# Patient Record
Sex: Male | Born: 1980 | Race: White | Hispanic: No | Marital: Married | Smoking: Never smoker
Health system: Southern US, Community
[De-identification: ages and names within clinical notes are randomized; demographics above are authoritative.]

## PROBLEM LIST (undated history)

## (undated) DIAGNOSIS — M1990 Unspecified osteoarthritis, unspecified site: Secondary | ICD-10-CM

## (undated) DIAGNOSIS — J45909 Unspecified asthma, uncomplicated: Secondary | ICD-10-CM

## (undated) DIAGNOSIS — F419 Anxiety disorder, unspecified: Secondary | ICD-10-CM

## (undated) HISTORY — PX: OTHER SURGICAL HISTORY: SHX169

## (undated) HISTORY — DX: Unspecified asthma, uncomplicated: J45.909

---

## 2001-07-12 HISTORY — PX: APPENDECTOMY: SHX54

## 2011-09-23 ENCOUNTER — Other Ambulatory Visit: Payer: Self-pay | Admitting: Family Medicine

## 2011-09-23 MED ORDER — AMOXICILLIN 500 MG PO CAPS: 500.0000 mg | ORAL_CAPSULE | Freq: Two times a day (BID) | ORAL | Status: AC

## 2012-01-24 ENCOUNTER — Encounter: Payer: Self-pay | Admitting: *Deleted

## 2012-11-22 ENCOUNTER — Encounter: Payer: Self-pay | Admitting: Sports Medicine

## 2012-11-22 NOTE — Progress Notes (Signed)
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 Patient states he notices first while wearing some new custom orthotics as well as increasing some higher impact exercises. Patient does not remember any true injury to the area but started having a chronic dull aching sensation over the fourth and fifth metatarsals near the base that seem to be worse until it was severe pain. Patient did have a musculoskeletal ultrasound that did show cortical defect with hypoechoic changes at that time and was treated for stress fracture. Over the course of time patient has been doing the [employer] for 2 different durations of 3 weeks with some improvement. Patient has not started any true exercise but states having more pain now in the last week with even non weight bearing exercises such as biking. Patient is also tried ibuprofen with no help. Attempted sports insoles and metatarsal pads with minimal improvement.   Past surgical history is significant for 3 different surgeries of the ankles bilaterally when patient was a child. Patient is also had an appendectomy and 2003  No medications  Past medical history: Club feet, multiple orthopaedic injuries overtime but no significant surgery needed.   Social Hx:  No smoke, occ alcohol, married, from [place] originally.    Physical Exam Gen: NAD alert Right foot exam:  Cavus foot with severe supination.  mild swelling noted at distal 4/5th interspace.  TTP over base of fourth MTP and interspace between 3/4th and 4/5, NVI distally, able to ambulate without antalgic gait.    Assessment Right foot pain 6 months duration- ? Occult fracture, nueroma, vs ? AVN  Plan:  U/s inconclusive, xray unremarkable, need further imaging with MRI. Will order and discuss further.

## 2012-11-23 ENCOUNTER — Other Ambulatory Visit: Payer: Self-pay | Admitting: Sports Medicine

## 2012-11-23 ENCOUNTER — Encounter: Payer: Self-pay | Admitting: Sports Medicine

## 2012-11-23 MED ORDER — MELOXICAM 15 MG PO TABS: ORAL_TABLET | ORAL | Status: DC

## 2012-11-23 NOTE — Progress Notes (Signed)
On ultrasound Steve Dickerson has a third and fourth metatarsals stress injury as well as intermetatarsal bursitis between the fourth and fifth. Mobic.

## 2014-09-25 ENCOUNTER — Other Ambulatory Visit: Payer: Self-pay | Admitting: Internal Medicine

## 2014-09-25 MED ORDER — OSELTAMIVIR PHOSPHATE 75 MG PO CAPS: 75.0000 mg | ORAL_CAPSULE | Freq: Two times a day (BID) | ORAL | Status: DC

## 2014-09-25 NOTE — Telephone Encounter (Signed)
Dr Katrinka BlazingSmith + sceen for Influenza  Tx:  tamiflu 75 bid x 5 days

## 2015-06-02 ENCOUNTER — Telehealth: Payer: Self-pay | Admitting: Family Medicine

## 2015-06-02 MED ORDER — AMOXICILLIN-POT CLAVULANATE 875-125 MG PO TABS: 1.0000 | ORAL_TABLET | Freq: Two times a day (BID) | ORAL | Status: DC

## 2015-06-02 NOTE — Telephone Encounter (Signed)
Patient has sinus pain and pressure that is not improving with OTC tx. Plan for ABX.

## 2016-10-19 ENCOUNTER — Other Ambulatory Visit: Payer: Self-pay

## 2016-10-19 MED ORDER — CEPHALEXIN 500 MG PO CAPS: 500.0000 mg | ORAL_CAPSULE | Freq: Four times a day (QID) | ORAL | 0 refills | Status: DC

## 2017-06-10 DIAGNOSIS — H5213 Myopia, bilateral: Secondary | ICD-10-CM | POA: Diagnosis not present

## 2017-07-14 NOTE — Progress Notes (Signed)
Subjective:    Patient ID: Steve Dickerson, male    DOB: 05/02/1981, 37 y.o.   MRN: [id number]  HPI He is here to establish with a new pcp.   He is here for a physical exam.   He has no concerns.  He had blood work done recently for life insurance and it was normal.    Medications and allergies reviewed with patient and updated if appropriate.  There are no active problems to display for this patient.   No current outpatient medications on file prior to visit.   No current facility-administered medications on file prior to visit.     Past Medical History:  Diagnosis Date  . Asthma    childhood only    Past Surgical History:  Procedure Laterality Date  . 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 Marital status: Married    Spouse name: None  . Number of children: None  . Years of education: None  . Highest education level: None  Social Needs  . Financial resource strain: None  . Food insecurity - worry: None  . Food insecurity - inability: None  . Transportation needs - medical: None  . Transportation needs - non-medical: None  Occupational History  . None  Tobacco Use  . Smoking status: Never Smoker  . Smokeless tobacco: Never Used  Substance and Sexual Activity  . Alcohol use: Yes    Comment: social  . Drug use: No  . Sexual activity: None  Other Topics Concern  . None  Social History Narrative   Married, 2 kids   Exercises regularly    Family History  Problem Relation Age of Onset  . Hypertension Mother   . COPD Mother   . Asthma Mother   . Diabetes Father   . Hyperlipidemia Father   . Hypertension Father   . Alcohol abuse Father   . Polycystic ovary syndrome Sister   . ADD / ADHD Brother   . Mental illness Maternal Aunt   . Dementia Maternal Grandmother   . Lymphoma Maternal Grandmother   . Parkinson's disease Maternal Grandfather   . Dementia Paternal Grandmother   . Heart disease  Paternal Grandfather        died of MI at 3761    Review of Systems  Constitutional: Negative for chills and fever.  Eyes: Negative for visual disturbance.  Respiratory: Negative for cough, shortness of breath and wheezing.   Cardiovascular: Negative for chest pain, palpitations and leg swelling.  Gastrointestinal: Negative for abdominal pain, blood in stool, constipation, diarrhea and nausea.  Genitourinary: Negative for dysuria and hematuria.  Musculoskeletal: Positive for arthralgias (chronic, intermittent ankle pain).  Skin: Negative for color change.  Neurological: Negative for light-headedness and headaches.  Psychiatric/Behavioral: Negative for dysphoric mood. The patient is not nervous/anxious.        Objective:   Vitals:   07/15/17 1248  BP: 122/82  Pulse: (!) 58  Resp: 16  Temp: 98.1 F (36.7 C)  SpO2: 98%   Wt Readings from Last 3 Encounters:  07/15/17 169 lb (76.7 kg)   Body mass index is 23.57 kg/m.   Physical Exam    Constitutional: He appears well-developed and well-nourished. No distress.  HENT:  Head: Normocephalic and atraumatic.  Right Ear: External ear normal.  Left Ear: External ear normal.  Mouth/Throat: Oropharynx is clear and moist.  Normal ear canals and TM b/l  Eyes: Conjunctivae and EOM are normal.  Neck: Neck supple. No tracheal deviation present. No thyromegaly present.  No carotid bruit  Cardiovascular: Normal rate, regular rhythm, normal heart sounds and intact distal pulses.   No murmur heard. Pulmonary/Chest: Effort normal and breath sounds normal. No respiratory distress. He has no wheezes. He has no rales.  Abdominal: Soft. He exhibits no distension. There is no tenderness.  Genitourinary: deferred  Musculoskeletal: He exhibits no edema.  Lymphadenopathy:   He has no cervical adenopathy.  Skin: Skin is warm and dry. He is not diaphoretic.  Psychiatric: He has a normal mood and affect. His behavior is normal.        Assessment & Plan:   Physical exam: Screening blood work   Done for [employer] - will hold off on blood work for now, but may check at a later date Immunizations   Up to date  Eye exams -  Has contacts - exams regularly Exercise   Regular exercise Weight  Normal BMI Skin    No concerns Substance abuse  none  See Problem List for Assessment and Plan of chronic medical problems.    See Problem List for Assessment and Plan of chronic medical problems.

## 2017-07-15 ENCOUNTER — Encounter: Payer: Self-pay | Admitting: Internal Medicine

## 2017-07-15 ENCOUNTER — Ambulatory Visit (INDEPENDENT_AMBULATORY_CARE_PROVIDER_SITE_OTHER): Payer: 59 | Admitting: Internal Medicine

## 2017-07-15 VITALS — BP 122/82 | HR 58 | Temp 98.1°F | Resp 16 | Ht 71.0 in | Wt 169.0 lb

## 2017-07-15 DIAGNOSIS — Z0000 Encounter for general adult medical examination without abnormal findings: Secondary | ICD-10-CM

## 2017-07-15 NOTE — Patient Instructions (Addendum)
   All other Health Maintenance issues reviewed.   All recommended immunizations and age-appropriate screenings are up-to-date or discussed.  No immunizations administered today.   Medications reviewed and updated.  No changes recommended at this time.    

## 2017-09-06 ENCOUNTER — Other Ambulatory Visit: Payer: Self-pay | Admitting: Internal Medicine

## 2017-09-06 MED ORDER — OSELTAMIVIR PHOSPHATE 75 MG PO CAPS: 75.0000 mg | ORAL_CAPSULE | Freq: Every day | ORAL | 0 refills | Status: DC

## 2017-10-27 ENCOUNTER — Other Ambulatory Visit: Payer: Self-pay | Admitting: Occupational Medicine

## 2017-10-28 LAB — COMPLETE METABOLIC PANEL WITH GFR
AG Ratio: 2 (calc) (ref 1.0–2.5)
ALT: 18 U/L (ref 9–46)
AST: 22 U/L (ref 10–40)
Albumin: 4.5 g/dL (ref 3.6–5.1)
Alkaline phosphatase (APISO): 64 U/L (ref 40–115)
BUN: 24 mg/dL (ref 7–25)
CO2: 30 mmol/L (ref 20–32)
Calcium: 9.6 mg/dL (ref 8.6–10.3)
Chloride: 103 mmol/L (ref 98–110)
Creat: 1.1 mg/dL (ref 0.60–1.35)
GFR, Est African American: 100 mL/min/{1.73_m2} (ref >=60)
GFR, Est Non African American: 87 mL/min/{1.73_m2} (ref >=60)
Globulin: 2.2 g/dL (calc) (ref 1.9–3.7)
Glucose, Bld: 61 mg/dL — ABNORMAL LOW (ref 65–99)
Potassium: 4.6 mmol/L (ref 3.5–5.3)
Sodium: 139 mmol/L (ref 135–146)
Total Bilirubin: 0.6 mg/dL (ref 0.2–1.2)
Total Protein: 6.7 g/dL (ref 6.1–8.1)

## 2017-10-28 LAB — CBC WITH DIFFERENTIAL/PLATELET
Basophils Absolute: 30 cells/uL (ref 0–200)
Basophils Relative: 0.4 %
Eosinophils Absolute: 60 cells/uL (ref 15–500)
Eosinophils Relative: 0.8 %
HCT: 48.8 % (ref 38.5–50.0)
Hemoglobin: 16.2 g/dL (ref 13.2–17.1)
Lymphs Abs: 1538 cells/uL (ref 850–3900)
MCH: 29 pg (ref 27.0–33.0)
MCHC: 33.2 g/dL (ref 32.0–36.0)
MCV: 87.5 fL (ref 80.0–100.0)
MPV: 10.5 fL (ref 7.5–12.5)
Monocytes Relative: 9.8 %
Neutro Abs: 5138 cells/uL (ref 1500–7800)
Neutrophils Relative %: 68.5 %
Platelets: 353 10*3/uL (ref 140–400)
RBC: 5.58 10*6/uL (ref 4.20–5.80)
RDW: 13 % (ref 11.0–15.0)
Total Lymphocyte: 20.5 %
WBC mixed population: 735 cells/uL (ref 200–950)
WBC: 7.5 10*3/uL (ref 3.8–10.8)

## 2017-10-28 LAB — LIPID PANEL
Cholesterol: 177 mg/dL (ref <200)
HDL: 57 mg/dL (ref >40)
LDL Cholesterol (Calc): 103 mg/dL (calc) — ABNORMAL HIGH
Non-HDL Cholesterol (Calc): 120 mg/dL (calc) (ref <130)
Total CHOL/HDL Ratio: 3.1 (calc) (ref <5.0)
Triglycerides: 84 mg/dL (ref <150)

## 2017-10-28 LAB — PSA: PSA: 0.3 ng/mL (ref <=4.0)

## 2018-02-06 ENCOUNTER — Ambulatory Visit (INDEPENDENT_AMBULATORY_CARE_PROVIDER_SITE_OTHER)
Admission: RE | Admit: 2018-02-06 | Discharge: 2018-02-06 | Disposition: A | Discharge status: Home / Self Care | Payer: 59 | Source: Ambulatory Visit | Attending: Internal Medicine | Admitting: Internal Medicine

## 2018-02-06 ENCOUNTER — Other Ambulatory Visit: Payer: Self-pay | Admitting: Internal Medicine

## 2018-02-06 DIAGNOSIS — R102 Pelvic and perineal pain: Secondary | ICD-10-CM

## 2018-02-06 DIAGNOSIS — M545 Low back pain, unspecified: Secondary | ICD-10-CM

## 2018-02-08 ENCOUNTER — Other Ambulatory Visit (INDEPENDENT_AMBULATORY_CARE_PROVIDER_SITE_OTHER): Payer: 59

## 2018-02-08 ENCOUNTER — Encounter: Payer: Self-pay | Admitting: Internal Medicine

## 2018-02-08 ENCOUNTER — Ambulatory Visit (INDEPENDENT_AMBULATORY_CARE_PROVIDER_SITE_OTHER): Payer: 59 | Admitting: Internal Medicine

## 2018-02-08 VITALS — BP 138/96 | HR 55 | Ht 71.0 in | Wt 169.0 lb

## 2018-02-08 DIAGNOSIS — R1030 Lower abdominal pain, unspecified: Secondary | ICD-10-CM

## 2018-02-08 DIAGNOSIS — R590 Localized enlarged lymph nodes: Secondary | ICD-10-CM

## 2018-02-08 LAB — CBC WITH DIFFERENTIAL/PLATELET
Basophils Absolute: 0 10*3/uL (ref 0.0–0.1)
Basophils Relative: 0.7 % (ref 0.0–3.0)
Eosinophils Absolute: 0.1 10*3/uL (ref 0.0–0.7)
Eosinophils Relative: 0.9 % (ref 0.0–5.0)
HCT: 47.3 % (ref 39.0–52.0)
Hemoglobin: 16 g/dL (ref 13.0–17.0)
Lymphocytes Relative: 29.6 % (ref 12.0–46.0)
Lymphs Abs: 2.1 10*3/uL (ref 0.7–4.0)
MCHC: 33.9 g/dL (ref 30.0–36.0)
MCV: 89.6 fl (ref 78.0–100.0)
Monocytes Absolute: 0.7 10*3/uL (ref 0.1–1.0)
Monocytes Relative: 9.7 % (ref 3.0–12.0)
Neutro Abs: 4.2 10*3/uL (ref 1.4–7.7)
Neutrophils Relative %: 59.1 % (ref 43.0–77.0)
Platelets: 357 10*3/uL (ref 150.0–400.0)
RBC: 5.28 Mil/uL (ref 4.22–5.81)
RDW: 13.7 % (ref 11.5–15.5)
WBC: 7 10*3/uL (ref 4.0–10.5)

## 2018-02-08 LAB — COMPREHENSIVE METABOLIC PANEL
ALT: 23 U/L (ref 0–53)
AST: 20 U/L (ref 0–37)
Albumin: 4.6 g/dL (ref 3.5–5.2)
Alkaline Phosphatase: 66 U/L (ref 39–117)
BUN: 22 mg/dL (ref 6–23)
CO2: 30 mEq/L (ref 19–32)
Calcium: 9.6 mg/dL (ref 8.4–10.5)
Chloride: 101 mEq/L (ref 96–112)
Creatinine, Ser: 1.07 mg/dL (ref 0.40–1.50)
GFR: 83.12 mL/min (ref >60.00)
Glucose, Bld: 98 mg/dL (ref 70–99)
Potassium: 4.3 mEq/L (ref 3.5–5.1)
Sodium: 138 mEq/L (ref 135–145)
Total Bilirubin: 0.5 mg/dL (ref 0.2–1.2)
Total Protein: 7.3 g/dL (ref 6.0–8.3)

## 2018-02-08 LAB — URINALYSIS, ROUTINE W REFLEX MICROSCOPIC
Bilirubin Urine: NEGATIVE
Hgb urine dipstick: NEGATIVE
Ketones, ur: NEGATIVE
Leukocytes, UA: NEGATIVE
Nitrite: NEGATIVE
RBC / HPF: NONE SEEN (ref 0–?)
Specific Gravity, Urine: 1.01 (ref 1.000–1.030)
Total Protein, Urine: NEGATIVE
Urine Glucose: NEGATIVE
Urobilinogen, UA: 0.2 (ref 0.0–1.0)
pH: 6.5 (ref 5.0–8.0)

## 2018-02-08 LAB — C-REACTIVE PROTEIN: CRP: <0.1 mg/dL — ABNORMAL LOW (ref 0.5–20.0)

## 2018-02-08 LAB — SEDIMENTATION RATE: Sed Rate: 2 mm/hr (ref 0–15)

## 2018-02-08 NOTE — Assessment & Plan Note (Signed)
Check labs - cbc, cmp, crp, esr, hiv, rpr, ua, ucx Further evaluation depending on above results ? Infection, less likely cancer Consider [place] or Ct abd/pelvis

## 2018-02-08 NOTE — Progress Notes (Signed)
Subjective:    Patient ID: Steve Dickerson, male    DOB: 01/18/1981, 37 y.o.   MRN: [id number]  HPI The patient is here for an acute visit.  He started having left lower abdominal pain 3 weeks ago and thought he may have just strained himself lifting weights or doing something else.  The discomfort was intermittent.  Last weekend he thought he may have had a hernia - the pain was severe.  The next day the pain was a little better.  He had sharp pain with certain movements.  The pain then spread across his pubic symphysis toward the right side.  It also spreads back to the left SI joint pain.  He has had intermittent discomfort down his inner thigh b/l.     Today he noticed groin lymphadenitis.   He does not think other lymph nodes are enlarged.   He has traveled to [place] in the past month.   His wife and kids had a viral illness 2 weeks ago - possible cocksakie.        Medications and allergies reviewed with patient and updated if appropriate.  Patient Active Problem List   Diagnosis Date Noted  . Inguinal lymphadenopathy 02/08/2018    No current outpatient medications on file prior to visit.   No current facility-administered medications on file prior to visit.     Past Medical History:  Diagnosis Date  . Asthma    childhood only    Past Surgical History:  Procedure Laterality Date  . 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 Marital status: Married    Spouse name: Not on file  . Number of children: 2  . Years of education: Not on file  . Highest education level: Not on file  Occupational History  . Not on file  Social Needs  . Financial resource strain: Not on file  . Food insecurity:    Worry: Not on file    Inability: Not on file  . Transportation needs:    Medical: Not on file    Non-medical: Not on file  Tobacco Use  . Smoking status: Never Smoker  . Smokeless tobacco: Never Used  Substance and  Sexual Activity  . Alcohol use: Yes    Comment: social  . Drug use: No  . Sexual activity: Not on file  Lifestyle  . Physical activity:    Days per week: Not on file    Minutes per session: Not on file  . Stress: Not on file  Relationships  . Social connections:    Talks on phone: Not on file    Gets together: Not on file    Attends religious service: Not on file    Active member of club or organization: Not on file    Attends meetings of clubs or organizations: Not on file    Relationship status: Not on file  Other Topics Concern  . Not on file  Social History Narrative   Married, 2 kids   Exercises regularly    Family History  Problem Relation Age of Onset  . Hypertension Mother   . COPD Mother   . Asthma Mother   . Diabetes Father   . Hyperlipidemia Father   . Hypertension Father   . Alcohol abuse Father   . Polycystic ovary syndrome Sister   . ADD / ADHD Brother   . Mental illness Maternal Aunt   .  Dementia Maternal Grandmother   . Lymphoma Maternal Grandmother   . Parkinson's disease Maternal Grandfather   . Dementia Paternal Grandmother   . Heart disease Paternal Grandfather        died of MI at 66    Review of Systems  Constitutional: Negative for chills and fever.  Respiratory: Negative for cough, shortness of breath and wheezing.   Cardiovascular: Negative for chest pain, palpitations and leg swelling.  Gastrointestinal: Positive for abdominal pain (lower abdominal/pelvic). Negative for blood in stool, constipation and diarrhea.  Genitourinary: Negative for difficulty urinating, discharge, dysuria, frequency, hematuria and scrotal swelling.  Musculoskeletal: Positive for back pain.  Neurological: Positive for light-headedness (today). Negative for headaches.       Objective:   Vitals:   02/08/18 1234  BP: (!) 138/96  Pulse: (!) 55  SpO2: 96%   BP Readings from Last 3 Encounters:  02/08/18 (!) 138/96  07/15/17 122/82   Wt Readings from Last  3 Encounters:  02/08/18 169 lb (76.7 kg)  07/15/17 169 lb (76.7 kg)   Body mass index is 23.57 kg/m.   Physical Exam  Constitutional: He appears well-developed and well-nourished. No distress.  HENT:  Head: Normocephalic and atraumatic.  Neck: Neck supple. No tracheal deviation present. No thyromegaly present.  Abdominal: Soft. He exhibits no mass. There is tenderness (mild - across lower abdmen). There is no rebound and no guarding. No hernia.  Genitourinary:  Genitourinary Comments: Deferred - he denies lesions, swelling, tenderness  Musculoskeletal: He exhibits no edema.  Lymphadenopathy:       Head (right side): No preauricular, no posterior auricular and no occipital adenopathy present.       Head (left side): No preauricular, no posterior auricular and no occipital adenopathy present.       Right cervical: No superficial cervical and no posterior cervical adenopathy present.      Left cervical: No superficial cervical and no posterior cervical adenopathy present.    He has no axillary adenopathy.       Right: Inguinal adenopathy present. No supraclavicular adenopathy present.       Left: Inguinal adenopathy present. No supraclavicular adenopathy present.  Skin: Skin is warm and dry. He is not diaphoretic.         Assessment & Plan:    See Problem List for Assessment and Plan of chronic medical problems.

## 2018-02-08 NOTE — Assessment & Plan Note (Signed)
As above - no obvious hernia, no GI symptoms or GU symptoms Has some inguinal lymphadenopathy, lightheadedness today - not feeling well See results of above tests and will determine further w/u at that time

## 2018-02-10 LAB — URINE CULTURE
MICRO NUMBER:: 90905398
SPECIMEN QUALITY:: ADEQUATE

## 2018-02-10 LAB — HIV ANTIBODY (ROUTINE TESTING W REFLEX): HIV 1&2 Ab, 4th Generation: NONREACTIVE

## 2018-02-10 LAB — RPR: RPR Ser Ql: NONREACTIVE

## 2018-02-13 ENCOUNTER — Other Ambulatory Visit: Payer: Self-pay | Admitting: Internal Medicine

## 2018-02-13 DIAGNOSIS — R1032 Left lower quadrant pain: Secondary | ICD-10-CM

## 2018-02-13 DIAGNOSIS — R590 Localized enlarged lymph nodes: Secondary | ICD-10-CM

## 2018-02-13 DIAGNOSIS — R599 Enlarged lymph nodes, unspecified: Secondary | ICD-10-CM

## 2018-02-13 DIAGNOSIS — R1030 Lower abdominal pain, unspecified: Secondary | ICD-10-CM

## 2018-02-15 ENCOUNTER — Ambulatory Visit
Admission: RE | Admit: 2018-02-15 | Discharge: 2018-02-15 | Disposition: A | Discharge status: Home / Self Care | Payer: 59 | Source: Ambulatory Visit | Attending: Internal Medicine | Admitting: Internal Medicine

## 2018-02-15 DIAGNOSIS — K4090 Unilateral inguinal hernia, without obstruction or gangrene, not specified as recurrent: Secondary | ICD-10-CM | POA: Diagnosis not present

## 2018-02-15 DIAGNOSIS — R599 Enlarged lymph nodes, unspecified: Secondary | ICD-10-CM

## 2018-02-15 DIAGNOSIS — R1032 Left lower quadrant pain: Secondary | ICD-10-CM

## 2018-02-15 MED ORDER — GADOBENATE DIMEGLUMINE 529 MG/ML IV SOLN
15.0000 mL | Freq: Once | INTRAVENOUS | Status: AC | PRN
  Administered 2018-02-15: 15 mL via INTRAVENOUS

## 2018-04-03 ENCOUNTER — Other Ambulatory Visit: Payer: Self-pay | Admitting: Internal Medicine

## 2018-04-03 MED ORDER — VITAMIN D (ERGOCALCIFEROL) 1.25 MG (50000 UNIT) PO CAPS: 50000.0000 [IU] | ORAL_CAPSULE | ORAL | 1 refills | Status: DC

## 2018-04-10 ENCOUNTER — Other Ambulatory Visit: Payer: Self-pay

## 2018-04-10 ENCOUNTER — Ambulatory Visit (INDEPENDENT_AMBULATORY_CARE_PROVIDER_SITE_OTHER)
Admission: RE | Admit: 2018-04-10 | Discharge: 2018-04-10 | Disposition: A | Discharge status: Home / Self Care | Payer: 59 | Source: Ambulatory Visit | Attending: Family Medicine | Admitting: Family Medicine

## 2018-04-10 DIAGNOSIS — M25571 Pain in right ankle and joints of right foot: Secondary | ICD-10-CM

## 2018-04-10 DIAGNOSIS — S99911A Unspecified injury of right ankle, initial encounter: Secondary | ICD-10-CM | POA: Diagnosis not present

## 2018-04-10 DIAGNOSIS — M7989 Other specified soft tissue disorders: Secondary | ICD-10-CM | POA: Diagnosis not present

## 2018-04-21 DIAGNOSIS — H5213 Myopia, bilateral: Secondary | ICD-10-CM | POA: Diagnosis not present

## 2018-04-27 ENCOUNTER — Other Ambulatory Visit: Payer: Self-pay | Admitting: Internal Medicine

## 2018-04-27 DIAGNOSIS — M25571 Pain in right ankle and joints of right foot: Secondary | ICD-10-CM

## 2018-05-05 ENCOUNTER — Ambulatory Visit
Admission: RE | Admit: 2018-05-05 | Discharge: 2018-05-05 | Disposition: A | Discharge status: Home / Self Care | Payer: 59 | Source: Ambulatory Visit | Attending: Internal Medicine | Admitting: Internal Medicine

## 2018-05-05 DIAGNOSIS — S99911A Unspecified injury of right ankle, initial encounter: Secondary | ICD-10-CM | POA: Diagnosis not present

## 2018-05-05 DIAGNOSIS — M25571 Pain in right ankle and joints of right foot: Secondary | ICD-10-CM

## 2018-06-13 ENCOUNTER — Other Ambulatory Visit: Payer: Self-pay

## 2018-06-13 ENCOUNTER — Ambulatory Visit (INDEPENDENT_AMBULATORY_CARE_PROVIDER_SITE_OTHER)
Admission: RE | Admit: 2018-06-13 | Discharge: 2018-06-13 | Disposition: A | Discharge status: Home / Self Care | Payer: 59 | Source: Ambulatory Visit | Attending: Internal Medicine | Admitting: Internal Medicine

## 2018-06-13 ENCOUNTER — Other Ambulatory Visit (INDEPENDENT_AMBULATORY_CARE_PROVIDER_SITE_OTHER): Payer: 59

## 2018-06-13 DIAGNOSIS — M2550 Pain in unspecified joint: Secondary | ICD-10-CM

## 2018-06-13 DIAGNOSIS — G8929 Other chronic pain: Secondary | ICD-10-CM

## 2018-06-13 DIAGNOSIS — M25571 Pain in right ankle and joints of right foot: Secondary | ICD-10-CM

## 2018-06-13 DIAGNOSIS — M19071 Primary osteoarthritis, right ankle and foot: Secondary | ICD-10-CM | POA: Diagnosis not present

## 2018-06-13 LAB — FERRITIN: Ferritin: 47.3 ng/mL (ref 22.0–322.0)

## 2018-06-13 LAB — CBC WITH DIFFERENTIAL/PLATELET
Basophils Absolute: 0.1 10*3/uL (ref 0.0–0.1)
Basophils Relative: 0.7 % (ref 0.0–3.0)
Eosinophils Absolute: 0 10*3/uL (ref 0.0–0.7)
Eosinophils Relative: 0.2 % (ref 0.0–5.0)
HCT: 50.6 % (ref 39.0–52.0)
Hemoglobin: 16.8 g/dL (ref 13.0–17.0)
Lymphocytes Relative: 25.9 % (ref 12.0–46.0)
Lymphs Abs: 2.1 10*3/uL (ref 0.7–4.0)
MCHC: 33.2 g/dL (ref 30.0–36.0)
MCV: 89.4 fl (ref 78.0–100.0)
Monocytes Absolute: 0.7 10*3/uL (ref 0.1–1.0)
Monocytes Relative: 8.7 % (ref 3.0–12.0)
Neutro Abs: 5.2 10*3/uL (ref 1.4–7.7)
Neutrophils Relative %: 64.5 % (ref 43.0–77.0)
Platelets: 351 10*3/uL (ref 150.0–400.0)
RBC: 5.66 Mil/uL (ref 4.22–5.81)
RDW: 14.1 % (ref 11.5–15.5)
WBC: 8 10*3/uL (ref 4.0–10.5)

## 2018-06-13 LAB — SEDIMENTATION RATE: Sed Rate: 2 mm/hr (ref 0–15)

## 2018-06-13 LAB — D-DIMER, QUANTITATIVE: D-Dimer, Quant: <0.19 mcg/mL FEU (ref <0.50)

## 2018-06-13 LAB — URIC ACID: Uric Acid, Serum: 5.9 mg/dL (ref 4.0–7.8)

## 2018-06-13 LAB — VITAMIN D 25 HYDROXY (VIT D DEFICIENCY, FRACTURES): VITD: 39.98 ng/mL (ref 30.00–100.00)

## 2018-06-13 LAB — C-REACTIVE PROTEIN: CRP: <0.1 mg/dL — ABNORMAL LOW (ref 0.5–20.0)

## 2018-06-23 ENCOUNTER — Other Ambulatory Visit: Payer: Self-pay | Admitting: *Deleted

## 2018-06-23 DIAGNOSIS — R29898 Other symptoms and signs involving the musculoskeletal system: Secondary | ICD-10-CM

## 2018-06-23 NOTE — Progress Notes (Signed)
Bilateral Lower extremity weakness  I evaluated Steve Dickerson in a conference setting.  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  Now he is starting to have stress fractures that related to lack of muscular support for his activity level.  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  I tried him in a sample Toe-Off brace and he definitely improved his gait by using this.  I think he would benefit from Toe-off braces.  Ideally will use them bilaterally for most weight bearing activity.  Can start with weaker side should he choose and progress.  He may require some orthotic adjustment in his shoes to help better accomodate the braces.  Steve BigKB Fields, MD

## 2018-08-23 ENCOUNTER — Other Ambulatory Visit: Payer: Self-pay

## 2018-08-23 MED ORDER — GABAPENTIN 100 MG PO CAPS: ORAL_CAPSULE | ORAL | 1 refills | Status: DC

## 2018-08-23 MED ORDER — PREDNISONE 50 MG PO TABS: ORAL_TABLET | ORAL | 0 refills | Status: DC

## 2018-08-23 MED ORDER — GABAPENTIN 100 MG PO CAPS: 100.0000 mg | ORAL_CAPSULE | Freq: Every day | ORAL | 1 refills | Status: DC

## 2018-09-21 ENCOUNTER — Other Ambulatory Visit: Payer: Self-pay

## 2018-09-21 DIAGNOSIS — M25512 Pain in left shoulder: Secondary | ICD-10-CM

## 2018-09-25 ENCOUNTER — Other Ambulatory Visit: Payer: Self-pay

## 2018-09-25 ENCOUNTER — Ambulatory Visit (INDEPENDENT_AMBULATORY_CARE_PROVIDER_SITE_OTHER)
Admission: RE | Admit: 2018-09-25 | Discharge: 2018-09-25 | Disposition: A | Discharge status: Home / Self Care | Payer: 59 | Source: Ambulatory Visit | Attending: Internal Medicine | Admitting: Internal Medicine

## 2018-09-25 DIAGNOSIS — G8929 Other chronic pain: Secondary | ICD-10-CM

## 2018-09-25 DIAGNOSIS — M25519 Pain in unspecified shoulder: Secondary | ICD-10-CM | POA: Diagnosis not present

## 2018-09-25 DIAGNOSIS — M25512 Pain in left shoulder: Secondary | ICD-10-CM

## 2018-09-25 DIAGNOSIS — M25511 Pain in right shoulder: Secondary | ICD-10-CM

## 2019-04-12 ENCOUNTER — Other Ambulatory Visit: Payer: Self-pay | Admitting: Occupational Medicine

## 2019-04-12 LAB — CBC WITH DIFFERENTIAL/PLATELET
Absolute Monocytes: 706 cells/uL (ref 200–950)
Basophils Absolute: 58 cells/uL (ref 0–200)
Basophils Relative: 0.8 %
Eosinophils Absolute: 43 cells/uL (ref 15–500)
Eosinophils Relative: 0.6 %
HCT: 49.5 % (ref 38.5–50.0)
Hemoglobin: 16.4 g/dL (ref 13.2–17.1)
Lymphs Abs: 2614 cells/uL (ref 850–3900)
MCH: 29.3 pg (ref 27.0–33.0)
MCHC: 33.1 g/dL (ref 32.0–36.0)
MCV: 88.4 fL (ref 80.0–100.0)
MPV: 10.2 fL (ref 7.5–12.5)
Monocytes Relative: 9.8 %
Neutro Abs: 3780 cells/uL (ref 1500–7800)
Neutrophils Relative %: 52.5 %
Platelets: 374 10*3/uL (ref 140–400)
RBC: 5.6 10*6/uL (ref 4.20–5.80)
RDW: 12.8 % (ref 11.0–15.0)
Total Lymphocyte: 36.3 %
WBC: 7.2 10*3/uL (ref 3.8–10.8)

## 2019-04-12 LAB — COMPLETE METABOLIC PANEL WITH GFR
AG Ratio: 1.9 (calc) (ref 1.0–2.5)
ALT: 30 U/L (ref 9–46)
AST: 26 U/L (ref 10–40)
Albumin: 4.6 g/dL (ref 3.6–5.1)
Alkaline phosphatase (APISO): 74 U/L (ref 36–130)
BUN: 17 mg/dL (ref 7–25)
CO2: 27 mmol/L (ref 20–32)
Calcium: 9.7 mg/dL (ref 8.6–10.3)
Chloride: 100 mmol/L (ref 98–110)
Creat: 0.95 mg/dL (ref 0.60–1.35)
GFR, Est African American: 118 mL/min/{1.73_m2} (ref >=60)
GFR, Est Non African American: 102 mL/min/{1.73_m2} (ref >=60)
Globulin: 2.4 g/dL (calc) (ref 1.9–3.7)
Glucose, Bld: 61 mg/dL — ABNORMAL LOW (ref 65–99)
Potassium: 5.2 mmol/L (ref 3.5–5.3)
Sodium: 140 mmol/L (ref 135–146)
Total Bilirubin: 0.5 mg/dL (ref 0.2–1.2)
Total Protein: 7 g/dL (ref 6.1–8.1)

## 2019-04-12 LAB — LIPID PANEL
Cholesterol: 193 mg/dL (ref <200)
HDL: 59 mg/dL (ref >=40)
LDL Cholesterol (Calc): 112 mg/dL (calc) — ABNORMAL HIGH
Non-HDL Cholesterol (Calc): 134 mg/dL (calc) — ABNORMAL HIGH (ref <130)
Total CHOL/HDL Ratio: 3.3 (calc) (ref <5.0)
Triglycerides: 109 mg/dL (ref <150)

## 2019-04-12 LAB — SPECIMEN COMPROMISED

## 2019-04-12 LAB — PSA: PSA: 0.2 ng/mL (ref <=4.0)

## 2019-09-26 ENCOUNTER — Telehealth: Payer: Self-pay | Admitting: Family Medicine

## 2019-09-26 DIAGNOSIS — L03032 Cellulitis of left toe: Secondary | ICD-10-CM

## 2019-09-26 DIAGNOSIS — B351 Tinea unguium: Secondary | ICD-10-CM

## 2019-09-26 MED ORDER — DOXYCYCLINE HYCLATE 100 MG PO TABS: 100.0000 mg | ORAL_TABLET | Freq: Two times a day (BID) | ORAL | 0 refills | Status: AC

## 2019-09-26 NOTE — Telephone Encounter (Signed)
Left great toe paronychia.  Treatment with doxycycline for 7 to 10 days. Patient has onychomycosis which may be increasing his risk for paronychia or cellulitis.

## 2019-10-17 ENCOUNTER — Other Ambulatory Visit: Payer: Self-pay

## 2019-10-17 DIAGNOSIS — G5752 Tarsal tunnel syndrome, left lower limb: Secondary | ICD-10-CM

## 2019-10-18 ENCOUNTER — Ambulatory Visit (INDEPENDENT_AMBULATORY_CARE_PROVIDER_SITE_OTHER): Payer: 59

## 2019-10-18 ENCOUNTER — Other Ambulatory Visit: Payer: Self-pay

## 2019-10-18 DIAGNOSIS — G5752 Tarsal tunnel syndrome, left lower limb: Secondary | ICD-10-CM

## 2019-10-18 DIAGNOSIS — M19072 Primary osteoarthritis, left ankle and foot: Secondary | ICD-10-CM | POA: Diagnosis not present

## 2019-10-19 NOTE — Progress Notes (Signed)
X-ray foot shows effectively normal great toe.  Ankle and midfoot of course have chronic changes

## 2019-11-29 ENCOUNTER — Encounter: Payer: Self-pay | Admitting: Internal Medicine

## 2019-11-29 NOTE — Patient Instructions (Addendum)
All other Health Maintenance issues reviewed.   All recommended immunizations and age-appropriate screenings are up-to-date or discussed.  No immunization administered today.   Medications reviewed and updated.  Changes include :  none      Please followup in 1 year    Health Maintenance, Male Adopting a healthy lifestyle and getting preventive care are important in promoting health and wellness. Ask your health care provider about:  The right schedule for you to have regular tests and exams.  Things you can do on your own to prevent diseases and keep yourself healthy. What should I know about diet, weight, and exercise? Eat a healthy diet   Eat a diet that includes plenty of vegetables, fruits, low-fat dairy products, and lean protein.  Do not eat a lot of foods that are high in solid fats, added sugars, or sodium. Maintain a healthy weight Body mass index (BMI) is a measurement that can be used to identify possible weight problems. It estimates body fat based on height and weight. Your health care provider can help determine your BMI and help you achieve or maintain a healthy weight. Get regular exercise Get regular exercise. This is one of the most important things you can do for your health. Most adults should:  Exercise for at least 150 minutes each week. The exercise should increase your heart rate and make you sweat (moderate-intensity exercise).  Do strengthening exercises at least twice a week. This is in addition to the moderate-intensity exercise.  Spend less time sitting. Even light physical activity can be beneficial. Watch cholesterol and blood lipids Have your blood tested for lipids and cholesterol at 38 years of age, then have this test every 5 years. You may need to have your cholesterol levels checked more often if:  Your lipid or cholesterol levels are high.  You are older than 38 years of age.  You are at high risk for heart disease. What  should I know about cancer screening? Many types of cancers can be detected early and may often be prevented. Depending on your health history and family history, you may need to have cancer screening at various ages. This may include screening for:  Colorectal cancer.  Prostate cancer.  Skin cancer.  Lung cancer. What should I know about heart disease, diabetes, and high blood pressure? Blood pressure and heart disease  High blood pressure causes heart disease and increases the risk of stroke. This is more likely to develop in people who have high blood pressure readings, are of African descent, or are overweight.  Talk with your health care provider about your target blood pressure readings.  Have your blood pressure checked: ? Every 3-5 years if you are 61-47 years of age. ? Every year if you are 3 years old or older.  If you are between the ages of 71 and 71 and are a current or former smoker, ask your health care provider if you should have a one-time screening for abdominal aortic aneurysm (AAA). Diabetes Have regular diabetes screenings. This checks your fasting blood sugar level. Have the screening done:  Once every three years after age 61 if you are at a normal weight and have a low risk for diabetes.  More often and at a younger age if you are overweight or have a high risk for diabetes. What should I know about preventing infection? Hepatitis B If you have a higher risk for hepatitis B, you should be screened for this virus. Talk  with your health care provider to find out if you are at risk for hepatitis B infection. Hepatitis C Blood testing is recommended for:  Everyone born from [birth year] through 1964.  Anyone with known risk factors for hepatitis C. Sexually transmitted infections (STIs)  You should be screened each year for STIs, including gonorrhea and chlamydia, if: ? You are sexually active and are younger than 38 years of age. ? You are older than 38 years of  age and your health care provider tells you that you are at risk for this type of infection. ? Your sexual activity has changed since you were last screened, and you are at increased risk for chlamydia or gonorrhea. Ask your health care provider if you are at risk.  Ask your health care provider about whether you are at high risk for HIV. Your health care provider may recommend a prescription medicine to help prevent HIV infection. If you choose to take medicine to prevent HIV, you should first get tested for HIV. You should then be tested every 3 months for as long as you are taking the medicine. Follow these instructions at home: Lifestyle  Do not use any products that contain nicotine or tobacco, such as cigarettes, e-cigarettes, and chewing tobacco. If you need help quitting, ask your health care provider.  Do not use street drugs.  Do not share needles.  Ask your health care provider for help if you need support or information about quitting drugs. Alcohol use  Do not drink alcohol if your health care provider tells you not to drink.  If you drink alcohol: ? Limit how much you have to 0-2 drinks a day. ? Be aware of how much alcohol is in your drink. In the [place] one drink equals one 12 oz bottle of beer (355 mL), one 5 oz glass of wine (148 mL), or one 1 oz glass of hard liquor (44 mL). General instructions  Schedule regular health, dental, and eye exams.  Stay current with your vaccines.  Tell your health care provider if: ? You often feel depressed. ? You have ever been abused or do not feel safe at home. Summary  Adopting a healthy lifestyle and getting preventive care are important in promoting health and wellness.  Follow your health care provider's instructions about healthy diet, exercising, and getting tested or screened for diseases.  Follow your health care provider's instructions on monitoring your cholesterol and blood pressure. This information is not intended  to replace advice given to you by your health care provider. Make sure you discuss any questions you have with your health care provider. Document Revised: 06/21/2018 Document Reviewed: 06/21/2018 Elsevier Patient Education  2020 [employer].

## 2019-11-29 NOTE — Progress Notes (Signed)
Subjective:    Patient ID: Steve Dickerson, male    DOB: [birth year], 39 y.o.   MRN: [id number]  HPI He is here for a physical exam.   The past 5 days he has been experiencing bilateral thumb and big toe pain.  He had a little bit of inflammation in the right first MCP joint on ultrasound.  He denies any injury or new activity that could have caused it.  He wondered about something viral nature.  He denies any increased neck pain.  Energy level low to begin with-likely related to decreased sleep.  Medications and allergies reviewed with patient and updated if appropriate.  Patient Active Problem List   Diagnosis Date Noted  . Onychomycosis 09/26/2019    No current outpatient medications on file prior to visit.   No current facility-administered medications on file prior to visit.    Past Medical History:  Diagnosis Date  . Asthma    childhood only    Past Surgical History:  Procedure Laterality Date  . 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 Marital status: Married    Spouse name: Not on file  . Number of children: 2  . Years of education: Not on file  . Highest education level: Not on file  Occupational History  . Not on file  Tobacco Use  . Smoking status: Never Smoker  . Smokeless tobacco: Never Used  Substance and Sexual Activity  . Alcohol use: Yes    Comment: social  . Drug use: No  . Sexual activity: Not on file  Other Topics Concern  . Not on file  Social History Narrative   Married, 2 kids   Exercises regularly   Social Determinants of Health   Financial Resource Strain:   . Difficulty of Paying Living Expenses:   Food Insecurity:   . Worried About Programme researcher, broadcasting/film/video in the Last Year:   . Barista in the Last Year:   Transportation Needs:   . Freight forwarder (Medical):   Marland Kitchen Lack of Transportation (Non-Medical):   Physical Activity:   . Days of Exercise per Week:   .  Minutes of Exercise per Session:   Stress:   . Feeling of Stress :   Social Connections:   . Frequency of Communication with Friends and Family:   . Frequency of Social Gatherings with Friends and Family:   . Attends Religious Services:   . Active Member of Clubs or Organizations:   . Attends Banker Meetings:   Marland Kitchen Marital Status:     Family History  Problem Relation Age of Onset  . Hypertension Mother   . COPD Mother   . Asthma Mother   . Diabetes Father   . Hyperlipidemia Father   . Hypertension Father   . Alcohol abuse Father   . Polycystic ovary syndrome Sister   . ADD / ADHD Brother   . Mental illness Maternal Aunt   . Dementia Maternal Grandmother   . Lymphoma Maternal Grandmother   . Parkinson's disease Maternal Grandfather   . Dementia Paternal Grandmother   . Heart disease Paternal Grandfather        died of MI at 49    Review of Systems  Constitutional: Negative for chills and fever.  Eyes: Negative for visual disturbance.  Respiratory: Negative for cough, shortness of breath and wheezing.   Cardiovascular: Negative  for chest pain, palpitations and leg swelling.  Gastrointestinal: Negative for abdominal pain, blood in stool, constipation, diarrhea and nausea.  Genitourinary: Negative for difficulty urinating, dysuria and hematuria.  Musculoskeletal: Positive for arthralgias.  Skin: Negative for color change and rash.  Neurological: Negative for dizziness, light-headedness and headaches.  Psychiatric/Behavioral: Negative for dysphoric mood. The patient is not nervous/anxious.        Objective:   Vitals:   11/30/19 1532  BP: 138/84  Pulse: 65  Resp: 16  Temp: 98.3 F (36.8 C)  SpO2: 98%   Filed Weights   11/30/19 1532  Weight: 170 lb (77.1 kg)   Body mass index is 23.71 kg/m.  BP Readings from Last 3 Encounters:  11/30/19 138/84  02/08/18 (!) 138/96  07/15/17 122/82    Wt Readings from Last 3 Encounters:  11/30/19 170 lb (77.1  kg)  02/08/18 169 lb (76.7 kg)  07/15/17 169 lb (76.7 kg)     Physical Exam Constitutional: He appears well-developed and well-nourished. No distress.  HENT:  Head: Normocephalic and atraumatic.  Right Ear: External ear normal.  Left Ear: External ear normal.  Mouth/Throat: Oropharynx is clear and moist.  Normal ear canals and TM b/l  Eyes: Conjunctivae and EOM are normal.  Neck: Neck supple. No tracheal deviation present. No thyromegaly present. No carotid bruit  Cardiovascular: Normal rate, regular rhythm, normal heart sounds and intact distal pulses.   No murmur heard. No edema Pulmonary/Chest: Effort normal and breath sounds normal. No respiratory distress. He has no wheezes. He has no rales.  Abdominal: Soft. He exhibits no distension. There is no tenderness.  Genitourinary: deferred  Musculoskeletal: no joint deformity of gross swelling Lymphadenopathy:   He has no cervical adenopathy.  Skin: Skin is warm and dry. He is not diaphoretic.  Psychiatric: He has a normal mood and affect. His behavior is normal.         Assessment & Plan:   Physical exam: Screening blood work  Deferred - had blood work done last fall Immunizations    Up to date  Exercise   regular Weight  Normal BMI Substance abuse    none  See Problem List for Assessment and Plan of chronic medical problems.    This visit occurred during the SARS-CoV-2 public health emergency.  Safety protocols were in place, including screening questions prior to the visit, additional usage of staff PPE, and extensive cleaning of exam room while observing appropriate contact time as indicated for disinfecting solutions.

## 2019-11-30 ENCOUNTER — Encounter: Payer: Self-pay | Admitting: Internal Medicine

## 2019-11-30 ENCOUNTER — Other Ambulatory Visit: Payer: Self-pay

## 2019-11-30 ENCOUNTER — Ambulatory Visit (INDEPENDENT_AMBULATORY_CARE_PROVIDER_SITE_OTHER): Payer: 59 | Admitting: Internal Medicine

## 2019-11-30 VITALS — BP 138/84 | HR 65 | Temp 98.3°F | Resp 16 | Ht 71.0 in | Wt 170.0 lb

## 2019-11-30 DIAGNOSIS — Z0000 Encounter for general adult medical examination without abnormal findings: Secondary | ICD-10-CM

## 2020-04-03 ENCOUNTER — Encounter: Payer: Self-pay | Admitting: Internal Medicine

## 2020-04-03 ENCOUNTER — Ambulatory Visit (INDEPENDENT_AMBULATORY_CARE_PROVIDER_SITE_OTHER): Payer: 59

## 2020-04-03 ENCOUNTER — Other Ambulatory Visit: Payer: Self-pay | Admitting: Internal Medicine

## 2020-04-03 ENCOUNTER — Other Ambulatory Visit: Payer: Self-pay

## 2020-04-03 ENCOUNTER — Ambulatory Visit (INDEPENDENT_AMBULATORY_CARE_PROVIDER_SITE_OTHER): Payer: 59 | Admitting: Internal Medicine

## 2020-04-03 VITALS — BP 128/82 | HR 58

## 2020-04-03 DIAGNOSIS — R0789 Other chest pain: Secondary | ICD-10-CM

## 2020-04-03 DIAGNOSIS — R002 Palpitations: Secondary | ICD-10-CM

## 2020-04-03 DIAGNOSIS — J45909 Unspecified asthma, uncomplicated: Secondary | ICD-10-CM | POA: Insufficient documentation

## 2020-04-03 DIAGNOSIS — Z0184 Encounter for antibody response examination: Secondary | ICD-10-CM

## 2020-04-03 NOTE — Addendum Note (Signed)
Addended by: Pincus Sanes on: 04/03/2020 07:51 AM   Modules accepted: Orders

## 2020-04-03 NOTE — Assessment & Plan Note (Addendum)
Acute A few weeks of elevated HR, w/o true irregularity.  Baseline HR much higher than typical Has some chest pressure, ?pleuritic in nature Exam wnl now Will get labs - cbc, cmp, esr, d-dimer, tfts EKG today -  Sinus brady 58 bpm, non-specific t wave abnormality, no acute changes, no prior EKG for comparison CXR Consider Echo and possible holter/cardio eval

## 2020-04-03 NOTE — Assessment & Plan Note (Addendum)
Acute Substernal chest pressure - intermittent, ? Pleuritic in nature, ? positional No GERD, cold symptoms A few weeks of elevated HR, w/o true irregularity.  Baseline HR much higher than typical Exam wnl now -no obvious rub or murmur, HR RRR Will get labs - cbc, cmp, esr, d-dimer, tfts EKG, CXR today Consider Echo and possible holter/cardio eval

## 2020-04-03 NOTE — Progress Notes (Signed)
Subjective:    Patient ID: Steve Dickerson, male    DOB: 29-May-1981, 39 y.o.   MRN: [id number]  HPI The patient is here for an acute visit.  For a few weeks he has noticed his HR at baseline has been higher than usual - about 40-50 bmp higher.   He has had some mild palpitations and sensation of chest pressure at times.  The chest pressure is substernal and has a pleuritic feel to it at times.   He denies chest pain, lightheaded/dizziness.  He denies obvious irregularity to his heart beat.    No cold symptoms, fever.  Energy level is normal. No GI symptoms.  No gerd. No leg swelling.   A few weeks ago he did have increased stress, which has greatly improved.   He stopped caffeine and supplements ( vitamin d, mvi)  w/o change in his symptoms.     Medications and allergies reviewed with patient and updated if appropriate.  Patient Active Problem List   Diagnosis Date Noted  . Childhood asthma 04/03/2020  . Palpitations 04/03/2020  . Sensation of chest pressure 04/03/2020  . Onychomycosis 09/26/2019    No current outpatient medications on file prior to visit.   No current facility-administered medications on file prior to visit.    Past Medical History:  Diagnosis Date  . Asthma    childhood only    Past Surgical History:  Procedure Laterality Date  . 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 Marital status: Married    Spouse name: Not on file  . Number of children: 2  . Years of education: Not on file  . Highest education level: Not on file  Occupational History  . Not on file  Tobacco Use  . Smoking status: Never Smoker  . Smokeless tobacco: Never Used  Substance and Sexual Activity  . Alcohol use: Yes    Comment: social  . Drug use: No  . Sexual activity: Not on file  Other Topics Concern  . Not on file  Social History Narrative   Married, 2 kids   Exercises regularly   Social Determinants of  Health   Financial Resource Strain:   . Difficulty of Paying Living Expenses: Not on file  Food Insecurity:   . Worried About Programme researcher, broadcasting/film/video in the Last Year: Not on file  . Ran Out of Food in the Last Year: Not on file  Transportation Needs:   . Lack of Transportation (Medical): Not on file  . Lack of Transportation (Non-Medical): Not on file  Physical Activity:   . Days of Exercise per Week: Not on file  . Minutes of Exercise per Session: Not on file  Stress:   . Feeling of Stress : Not on file  Social Connections:   . Frequency of Communication with Friends and Family: Not on file  . Frequency of Social Gatherings with Friends and Family: Not on file  . Attends Religious Services: Not on file  . Active Member of Clubs or Organizations: Not on file  . Attends Banker Meetings: Not on file  . Marital Status: Not on file    Family History  Problem Relation Age of Onset  . Hypertension Mother   . COPD Mother   . Asthma Mother   . Diabetes Father   . Hyperlipidemia Father   . Hypertension Father   . Alcohol abuse  Father   . Polycystic ovary syndrome Sister   . ADD / ADHD Brother   . Mental illness Maternal Aunt   . Dementia Maternal Grandmother   . Lymphoma Maternal Grandmother   . Parkinson's disease Maternal Grandfather   . Dementia Paternal Grandmother   . Heart disease Paternal Grandfather        died of MI at 35    Review of Systems  Constitutional: Negative for fever.  Eyes: Negative for visual disturbance.  Respiratory: Negative for cough, shortness of breath and wheezing.   Cardiovascular: Positive for chest pain (pressure sensation) and palpitations. Negative for leg swelling.  Gastrointestinal: Negative for abdominal pain, constipation, diarrhea and nausea.  Neurological: Negative for dizziness, light-headedness and headaches.       Objective:   Vitals:   04/03/20 1224  BP: 128/82  Pulse: (!) 58  SpO2: 98%   BP Readings from Last  3 Encounters:  04/03/20 128/82  11/30/19 138/84  02/08/18 (!) 138/96   Wt Readings from Last 3 Encounters:  11/30/19 170 lb (77.1 kg)  02/08/18 169 lb (76.7 kg)  07/15/17 169 lb (76.7 kg)   There is no height or weight on file to calculate BMI.   Physical Exam    Constitutional: Appears well-developed and well-nourished. No distress.  Head: Normocephalic and atraumatic.  Neck: Neck supple. No tracheal deviation present. No thyromegaly present.  No cervical lymphadenopathy Cardiovascular: Normal rate, regular rhythm and normal heart sounds.  No murmur heard. No carotid bruit .  No edema Pulmonary/Chest: no chest wall tenderness. Effort normal and breath sounds normal. No respiratory distress. No has no wheezes. No rales.  Skin: Skin is warm and dry. Not diaphoretic.  Psychiatric: Normal mood and affect. Behavior is normal.         Assessment & Plan:    See Problem List for Assessment and Plan of chronic medical problems.    This visit occurred during the SARS-CoV-2 public health emergency.  Safety protocols were in place, including screening questions prior to the visit, additional usage of staff PPE, and extensive cleaning of exam room while observing appropriate contact time as indicated for disinfecting solutions.

## 2020-04-04 LAB — COMPLETE METABOLIC PANEL WITH GFR
AG Ratio: 2.1 (calc) (ref 1.0–2.5)
ALT: 19 U/L (ref 9–46)
AST: 22 U/L (ref 10–40)
Albumin: 4.8 g/dL (ref 3.6–5.1)
Alkaline phosphatase (APISO): 72 U/L (ref 36–130)
BUN: 16 mg/dL (ref 7–25)
CO2: 29 mmol/L (ref 20–32)
Calcium: 9.8 mg/dL (ref 8.6–10.3)
Chloride: 100 mmol/L (ref 98–110)
Creat: 0.93 mg/dL (ref 0.60–1.35)
GFR, Est African American: 120 mL/min/{1.73_m2} (ref >=60)
GFR, Est Non African American: 104 mL/min/{1.73_m2} (ref >=60)
Globulin: 2.3 g/dL (calc) (ref 1.9–3.7)
Glucose, Bld: 83 mg/dL (ref 65–99)
Potassium: 4.7 mmol/L (ref 3.5–5.3)
Sodium: 138 mmol/L (ref 135–146)
Total Bilirubin: 0.6 mg/dL (ref 0.2–1.2)
Total Protein: 7.1 g/dL (ref 6.1–8.1)

## 2020-04-04 LAB — CBC WITH DIFFERENTIAL/PLATELET
Absolute Monocytes: 772 cells/uL (ref 200–950)
Basophils Absolute: 62 cells/uL (ref 0–200)
Basophils Relative: 0.8 %
Eosinophils Absolute: 47 cells/uL (ref 15–500)
Eosinophils Relative: 0.6 %
HCT: 49.7 % (ref 38.5–50.0)
Hemoglobin: 16.5 g/dL (ref 13.2–17.1)
Lymphs Abs: 2153 cells/uL (ref 850–3900)
MCH: 30.1 pg (ref 27.0–33.0)
MCHC: 33.2 g/dL (ref 32.0–36.0)
MCV: 90.7 fL (ref 80.0–100.0)
MPV: 10.5 fL (ref 7.5–12.5)
Monocytes Relative: 9.9 %
Neutro Abs: 4766 cells/uL (ref 1500–7800)
Neutrophils Relative %: 61.1 %
Platelets: 357 10*3/uL (ref 140–400)
RBC: 5.48 10*6/uL (ref 4.20–5.80)
RDW: 12.5 % (ref 11.0–15.0)
Total Lymphocyte: 27.6 %
WBC: 7.8 10*3/uL (ref 3.8–10.8)

## 2020-04-04 LAB — SEDIMENTATION RATE: Sed Rate: 2 mm/h (ref 0–15)

## 2020-04-04 LAB — D-DIMER, QUANTITATIVE: D-Dimer, Quant: 0.19 mcg/mL FEU (ref <0.50)

## 2020-04-04 LAB — TSH: TSH: 2.03 mIU/L (ref 0.40–4.50)

## 2020-04-04 LAB — T3, FREE: T3, Free: 3.2 pg/mL (ref 2.3–4.2)

## 2020-04-04 LAB — SARS-COV-2 ANTIBODIES: SARS-CoV-2 Antibodies: NEGATIVE

## 2020-04-04 LAB — T4, FREE: Free T4: 1.4 ng/dL (ref 0.8–1.8)

## 2020-04-04 LAB — SPECIMEN STATUS REPORT

## 2020-04-08 ENCOUNTER — Other Ambulatory Visit: Payer: Self-pay | Admitting: Internal Medicine

## 2020-04-08 DIAGNOSIS — R002 Palpitations: Secondary | ICD-10-CM

## 2020-04-25 ENCOUNTER — Other Ambulatory Visit: Payer: Self-pay

## 2020-04-25 ENCOUNTER — Ambulatory Visit (HOSPITAL_COMMUNITY): Payer: 59 | Attending: Cardiology

## 2020-04-25 DIAGNOSIS — R002 Palpitations: Secondary | ICD-10-CM | POA: Diagnosis not present

## 2020-04-25 LAB — ECHOCARDIOGRAM COMPLETE
Area-P 1/2: 3.76 cm2
S' Lateral: 3.3 cm

## 2020-04-29 ENCOUNTER — Other Ambulatory Visit: Payer: Self-pay | Admitting: Internal Medicine

## 2020-04-29 ENCOUNTER — Other Ambulatory Visit (HOSPITAL_COMMUNITY): Payer: Self-pay | Admitting: Internal Medicine

## 2020-04-29 MED ORDER — BUSPIRONE HCL 5 MG PO TABS: 5.0000 mg | ORAL_TABLET | Freq: Three times a day (TID) | ORAL | 5 refills | Status: DC

## 2020-05-06 IMAGING — DX DG ANKLE COMPLETE 3+V*R*
3 series · 4 of 4 positions shown · non-contrast
Comparison: None.

CLINICAL DATA: Trauma 7 days ago with persistent pain and swelling,
initial encounter

EXAM:
RIGHT ANKLE - COMPLETE 3+ VIEW

[Series 1: ankle ap · 0.14mm/px · 2 of 2 slices shown]
[im 1/2]
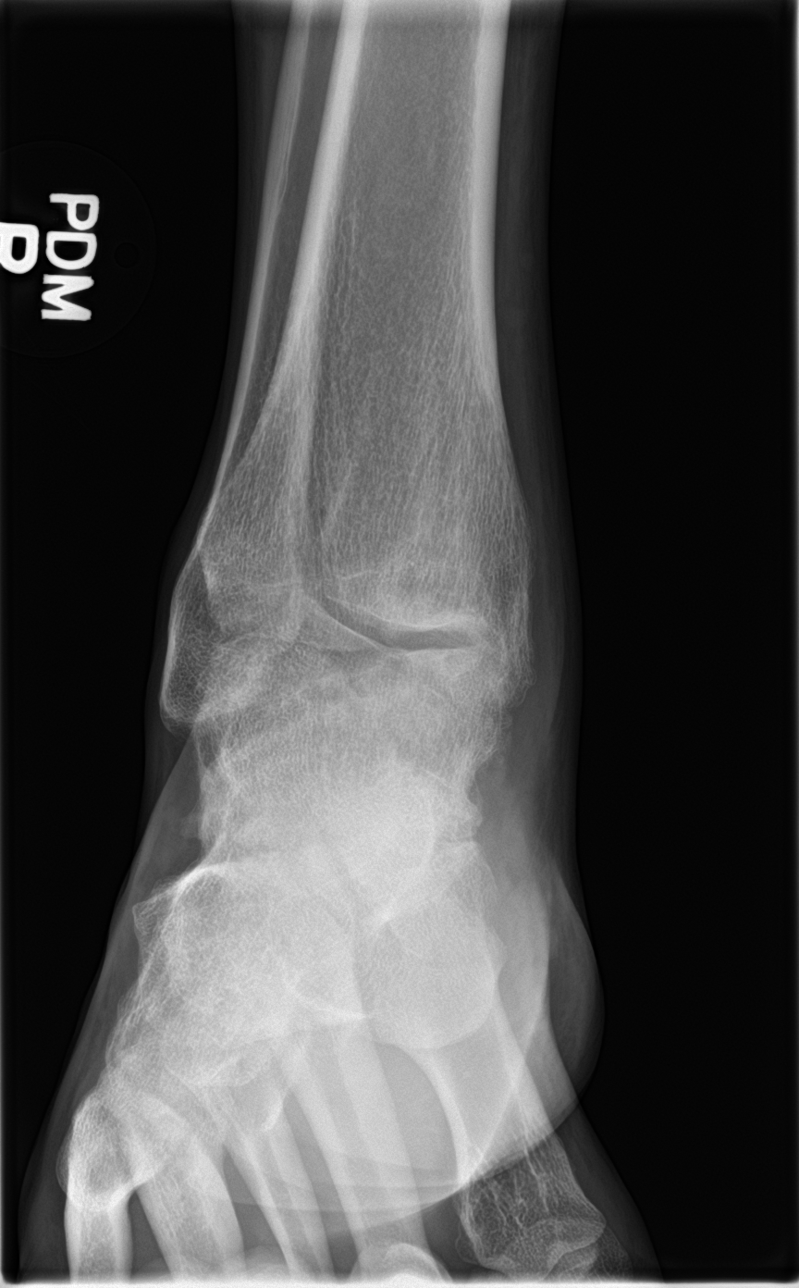
[im 2/2]
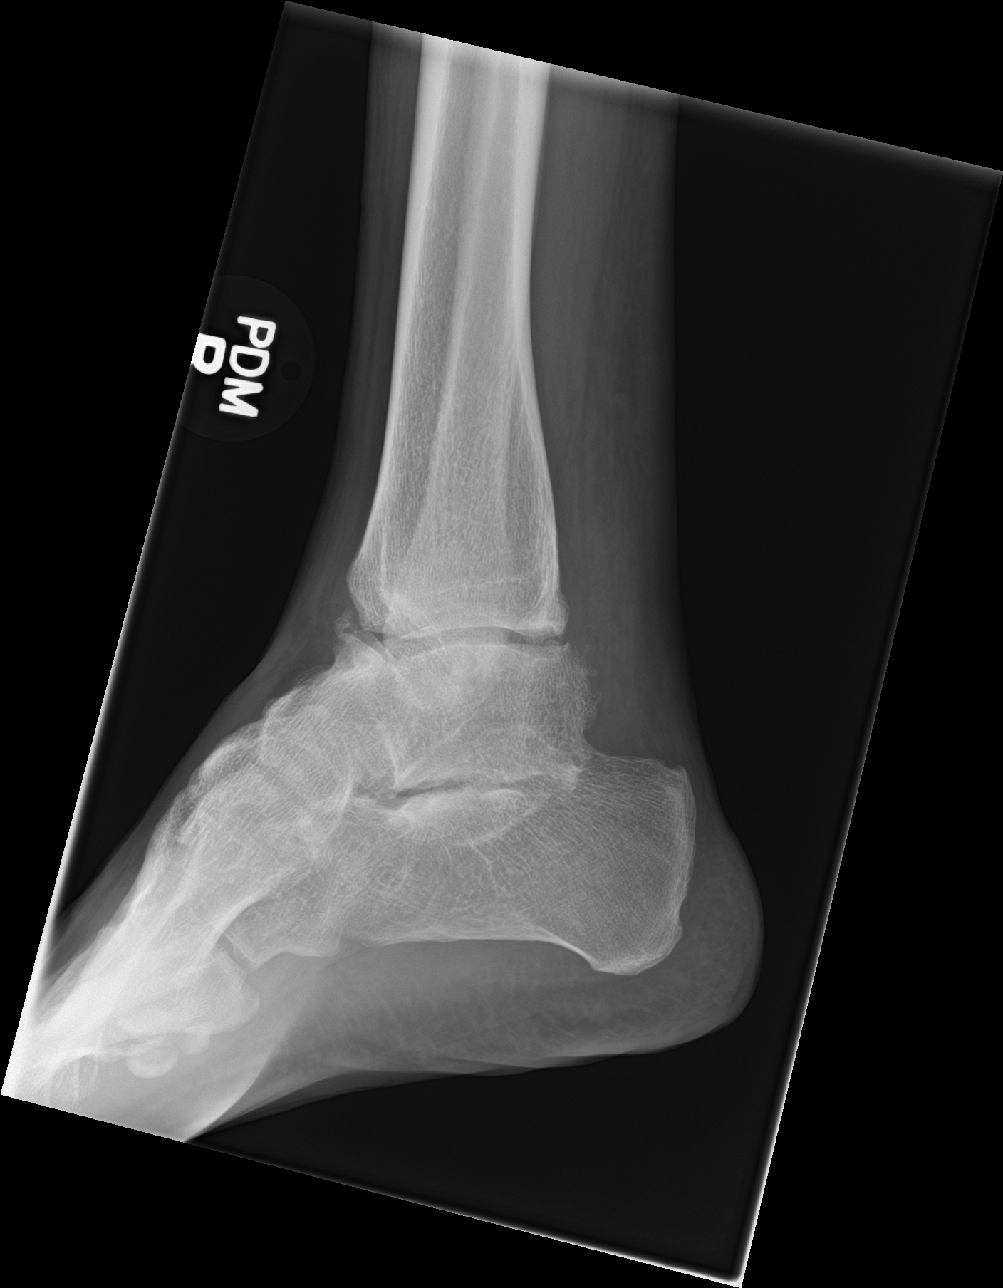

[ankle obl]
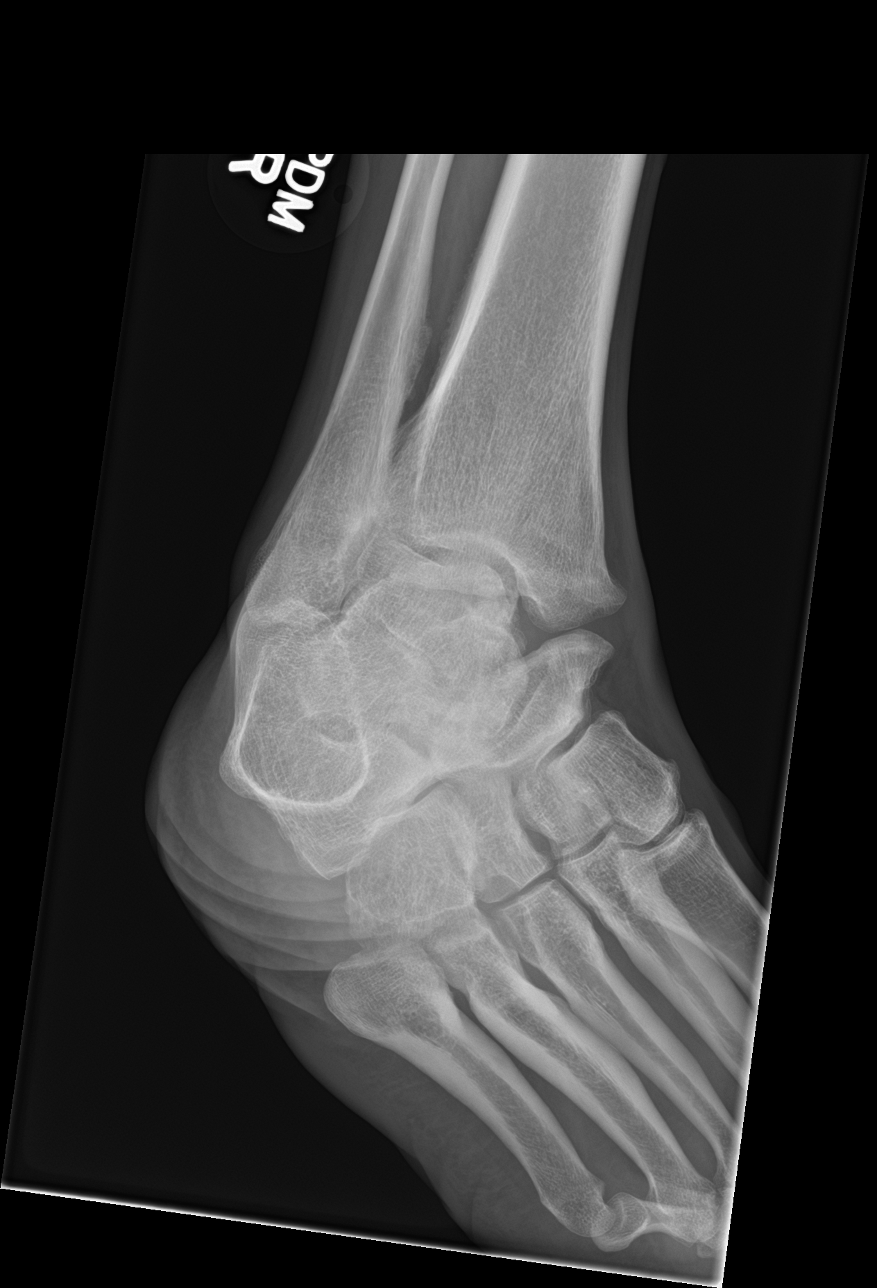

[ankle lat]
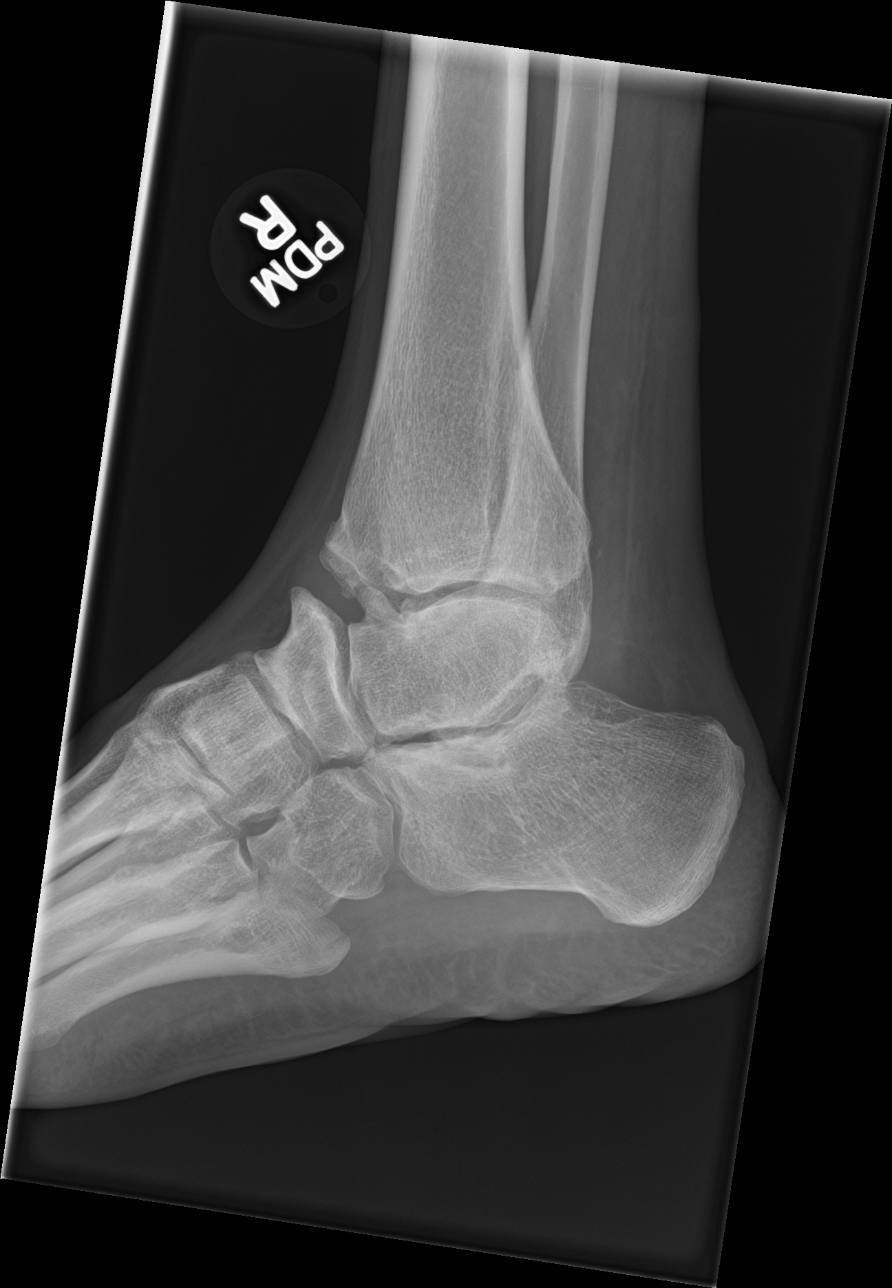

[4 of 4 positions shown; findings below may reference images not displayed]

FINDINGS: Significant remodeling of the talus is noted which appears to be
chronic in nature. Some degenerative changes at the talonavicular
joint are noted as well as within the tarsal bones. No soft tissue
abnormality is seen. No definitive fracture is noted.
IMPRESSION: Changes in the talus and tarsal bones which appear chronic in
nature. No acute abnormality is seen at this time.

## 2020-06-20 ENCOUNTER — Other Ambulatory Visit (HOSPITAL_COMMUNITY): Payer: Self-pay | Admitting: Family Medicine

## 2020-06-20 ENCOUNTER — Other Ambulatory Visit: Payer: Self-pay

## 2020-06-20 MED ORDER — DOXYCYCLINE HYCLATE 100 MG PO TABS: 100.0000 mg | ORAL_TABLET | Freq: Two times a day (BID) | ORAL | 0 refills | Status: DC

## 2020-06-20 MED ORDER — FLUCONAZOLE 150 MG PO TABS: 150.0000 mg | ORAL_TABLET | Freq: Every day | ORAL | 0 refills | Status: DC

## 2020-06-20 NOTE — Progress Notes (Unsigned)
diclofenca

## 2020-07-28 ENCOUNTER — Encounter: Payer: Self-pay | Admitting: Internal Medicine

## 2020-07-29 ENCOUNTER — Other Ambulatory Visit (HOSPITAL_COMMUNITY): Payer: Self-pay | Admitting: Internal Medicine

## 2020-07-29 MED ORDER — VENLAFAXINE HCL ER 37.5 MG PO CP24: 37.5000 mg | ORAL_CAPSULE | Freq: Every day | ORAL | 1 refills | Status: DC

## 2020-08-06 ENCOUNTER — Ambulatory Visit: Payer: 59 | Admitting: Psychology

## 2020-08-22 ENCOUNTER — Ambulatory Visit (INDEPENDENT_AMBULATORY_CARE_PROVIDER_SITE_OTHER): Payer: 59 | Admitting: Psychology

## 2020-08-22 DIAGNOSIS — F419 Anxiety disorder, unspecified: Secondary | ICD-10-CM | POA: Diagnosis not present

## 2020-10-17 ENCOUNTER — Ambulatory Visit: Payer: 59 | Admitting: Psychology

## 2020-10-23 ENCOUNTER — Other Ambulatory Visit (HOSPITAL_COMMUNITY): Payer: Self-pay

## 2020-10-23 MED FILL — Venlafaxine HCl Cap ER 24HR 37.5 MG (Base Equivalent): ORAL | 90 days supply | Qty: 90 | Fill #0 | Status: AC

## 2020-10-24 ENCOUNTER — Ambulatory Visit (INDEPENDENT_AMBULATORY_CARE_PROVIDER_SITE_OTHER): Payer: 59 | Admitting: Psychology

## 2020-10-24 DIAGNOSIS — F419 Anxiety disorder, unspecified: Secondary | ICD-10-CM

## 2020-10-25 ENCOUNTER — Other Ambulatory Visit (HOSPITAL_COMMUNITY): Payer: Self-pay

## 2021-01-22 ENCOUNTER — Other Ambulatory Visit (HOSPITAL_COMMUNITY): Payer: Self-pay

## 2021-01-22 ENCOUNTER — Other Ambulatory Visit: Payer: Self-pay | Admitting: Internal Medicine

## 2021-01-22 MED ORDER — VENLAFAXINE HCL ER 37.5 MG PO CP24
ORAL_CAPSULE | ORAL | 1 refills | Status: DC
  Filled 2021-01-22: qty 90, 90d supply, fill #0
  Filled 2021-04-27: qty 90, 90d supply, fill #1

## 2021-01-22 MED FILL — Buspirone HCl Tab 5 MG: ORAL | 30 days supply | Qty: 90 | Fill #0 | Status: AC

## 2021-02-03 ENCOUNTER — Other Ambulatory Visit (HOSPITAL_COMMUNITY): Payer: Self-pay

## 2021-04-27 ENCOUNTER — Other Ambulatory Visit (HOSPITAL_COMMUNITY): Payer: Self-pay

## 2021-04-29 ENCOUNTER — Other Ambulatory Visit (HOSPITAL_COMMUNITY): Payer: Self-pay

## 2021-04-30 ENCOUNTER — Observation Stay (HOSPITAL_BASED_OUTPATIENT_CLINIC_OR_DEPARTMENT_OTHER): Payer: 59

## 2021-04-30 ENCOUNTER — Encounter: Payer: Self-pay | Admitting: Internal Medicine

## 2021-04-30 ENCOUNTER — Encounter (HOSPITAL_COMMUNITY): Payer: Self-pay | Admitting: Internal Medicine

## 2021-04-30 ENCOUNTER — Ambulatory Visit (INDEPENDENT_AMBULATORY_CARE_PROVIDER_SITE_OTHER): Payer: 59 | Admitting: Internal Medicine

## 2021-04-30 ENCOUNTER — Emergency Department (HOSPITAL_COMMUNITY): Payer: 59

## 2021-04-30 ENCOUNTER — Other Ambulatory Visit: Payer: Self-pay

## 2021-04-30 ENCOUNTER — Observation Stay (HOSPITAL_COMMUNITY)
Admission: EM | Admit: 2021-04-30 | Discharge: 2021-05-01 | Disposition: A | Discharge status: Home / Self Care | Payer: 59 | Attending: Internal Medicine | Admitting: Internal Medicine

## 2021-04-30 ENCOUNTER — Inpatient Hospital Stay: Admission: RE | Admit: 2021-04-30 | Payer: 59 | Source: Ambulatory Visit

## 2021-04-30 VITALS — BP 148/92 | HR 76 | Temp 98.1°F

## 2021-04-30 DIAGNOSIS — G43409 Hemiplegic migraine, not intractable, without status migrainosus: Secondary | ICD-10-CM | POA: Diagnosis not present

## 2021-04-30 DIAGNOSIS — R202 Paresthesia of skin: Secondary | ICD-10-CM | POA: Diagnosis present

## 2021-04-30 DIAGNOSIS — I639 Cerebral infarction, unspecified: Principal | ICD-10-CM

## 2021-04-30 DIAGNOSIS — R531 Weakness: Secondary | ICD-10-CM | POA: Diagnosis not present

## 2021-04-30 DIAGNOSIS — I10 Essential (primary) hypertension: Secondary | ICD-10-CM | POA: Insufficient documentation

## 2021-04-30 DIAGNOSIS — R200 Anesthesia of skin: Secondary | ICD-10-CM

## 2021-04-30 DIAGNOSIS — Z20822 Contact with and (suspected) exposure to covid-19: Secondary | ICD-10-CM | POA: Insufficient documentation

## 2021-04-30 DIAGNOSIS — R29818 Other symptoms and signs involving the nervous system: Secondary | ICD-10-CM | POA: Diagnosis not present

## 2021-04-30 DIAGNOSIS — Y900 Blood alcohol level of less than 20 mg/100 ml: Secondary | ICD-10-CM | POA: Diagnosis not present

## 2021-04-30 DIAGNOSIS — Z79899 Other long term (current) drug therapy: Secondary | ICD-10-CM | POA: Insufficient documentation

## 2021-04-30 DIAGNOSIS — Q2112 Patent foramen ovale: Secondary | ICD-10-CM | POA: Diagnosis not present

## 2021-04-30 DIAGNOSIS — R030 Elevated blood-pressure reading, without diagnosis of hypertension: Secondary | ICD-10-CM | POA: Diagnosis not present

## 2021-04-30 DIAGNOSIS — Z8673 Personal history of transient ischemic attack (TIA), and cerebral infarction without residual deficits: Secondary | ICD-10-CM

## 2021-04-30 DIAGNOSIS — J45909 Unspecified asthma, uncomplicated: Secondary | ICD-10-CM | POA: Diagnosis not present

## 2021-04-30 HISTORY — DX: Unspecified osteoarthritis, unspecified site: M19.90

## 2021-04-30 HISTORY — DX: Anxiety disorder, unspecified: F41.9

## 2021-04-30 LAB — PROTIME-INR
INR: 1 (ref 0.8–1.2)
Prothrombin Time: 12.9 seconds (ref 11.4–15.2)

## 2021-04-30 LAB — RESP PANEL BY RT-PCR (FLU A&B, COVID) ARPGX2
Influenza A by PCR: NEGATIVE
Influenza B by PCR: NEGATIVE
SARS Coronavirus 2 by RT PCR: NEGATIVE

## 2021-04-30 LAB — I-STAT CHEM 8, ED
BUN: 17 mg/dL (ref 6–20)
Calcium, Ion: 1.15 mmol/L (ref 1.15–1.40)
Chloride: 102 mmol/L (ref 98–111)
Creatinine, Ser: 1 mg/dL (ref 0.61–1.24)
Glucose, Bld: 102 mg/dL — ABNORMAL HIGH (ref 70–99)
HCT: 54 % — ABNORMAL HIGH (ref 39.0–52.0)
Hemoglobin: 18.4 g/dL — ABNORMAL HIGH (ref 13.0–17.0)
Potassium: 4.6 mmol/L (ref 3.5–5.1)
Sodium: 140 mmol/L (ref 135–145)
TCO2: 27 mmol/L (ref 22–32)

## 2021-04-30 LAB — DIFFERENTIAL
Abs Immature Granulocytes: 0.03 10*3/uL (ref 0.00–0.07)
Basophils Absolute: 0.1 10*3/uL (ref 0.0–0.1)
Basophils Relative: 1 %
Eosinophils Absolute: 0.1 10*3/uL (ref 0.0–0.5)
Eosinophils Relative: 1 %
Immature Granulocytes: 0 %
Lymphocytes Relative: 19 %
Lymphs Abs: 1.8 10*3/uL (ref 0.7–4.0)
Monocytes Absolute: 0.8 10*3/uL (ref 0.1–1.0)
Monocytes Relative: 8 %
Neutro Abs: 6.7 10*3/uL (ref 1.7–7.7)
Neutrophils Relative %: 71 %

## 2021-04-30 LAB — CBC
HCT: 52.9 % — ABNORMAL HIGH (ref 39.0–52.0)
Hemoglobin: 17.6 g/dL — ABNORMAL HIGH (ref 13.0–17.0)
MCH: 29.9 pg (ref 26.0–34.0)
MCHC: 33.3 g/dL (ref 30.0–36.0)
MCV: 90 fL (ref 80.0–100.0)
Platelets: 386 10*3/uL (ref 150–400)
RBC: 5.88 MIL/uL — ABNORMAL HIGH (ref 4.22–5.81)
RDW: 13 % (ref 11.5–15.5)
WBC: 9.5 10*3/uL (ref 4.0–10.5)
nRBC: 0 % (ref 0.0–0.2)

## 2021-04-30 LAB — APTT: aPTT: 30 seconds (ref 24–36)

## 2021-04-30 LAB — ETHANOL: Alcohol, Ethyl (B): <10 mg/dL (ref <10)

## 2021-04-30 LAB — COMPREHENSIVE METABOLIC PANEL
ALT: 26 U/L (ref 0–44)
AST: 28 U/L (ref 15–41)
Albumin: 4.3 g/dL (ref 3.5–5.0)
Alkaline Phosphatase: 78 U/L (ref 38–126)
Anion gap: 9 (ref 5–15)
BUN: 14 mg/dL (ref 6–20)
CO2: 28 mmol/L (ref 22–32)
Calcium: 9.6 mg/dL (ref 8.9–10.3)
Chloride: 103 mmol/L (ref 98–111)
Creatinine, Ser: 1.06 mg/dL (ref 0.61–1.24)
GFR, Estimated: >60 mL/min (ref >60)
Glucose, Bld: 104 mg/dL — ABNORMAL HIGH (ref 70–99)
Potassium: 4.6 mmol/L (ref 3.5–5.1)
Sodium: 140 mmol/L (ref 135–145)
Total Bilirubin: 0.7 mg/dL (ref 0.3–1.2)
Total Protein: 7.4 g/dL (ref 6.5–8.1)

## 2021-04-30 LAB — TROPONIN I (HIGH SENSITIVITY): Troponin I (High Sensitivity): <2 ng/L (ref <18)

## 2021-04-30 LAB — CBG MONITORING, ED: Glucose-Capillary: 111 mg/dL — ABNORMAL HIGH (ref 70–99)

## 2021-04-30 MED ORDER — VENLAFAXINE HCL ER 37.5 MG PO CP24
37.5000 mg | ORAL_CAPSULE | Freq: Every day | ORAL | Status: DC
  Administered 2021-05-01: 37.5 mg via ORAL
  Filled 2021-04-30 (×2): qty 1

## 2021-04-30 MED ORDER — HYDRALAZINE HCL 25 MG PO TABS: 25.0000 mg | ORAL_TABLET | Freq: Four times a day (QID) | ORAL | Status: DC | PRN

## 2021-04-30 MED ORDER — ATORVASTATIN CALCIUM 40 MG PO TABS
40.0000 mg | ORAL_TABLET | Freq: Every day | ORAL | Status: DC
  Administered 2021-05-01: 40 mg via ORAL
  Filled 2021-04-30: qty 1

## 2021-04-30 MED ORDER — ACETAMINOPHEN 650 MG RE SUPP: 650.0000 mg | RECTAL | Status: DC | PRN

## 2021-04-30 MED ORDER — STROKE: EARLY STAGES OF RECOVERY BOOK
Freq: Once | Status: AC
  Filled 2021-04-30: qty 1

## 2021-04-30 MED ORDER — BUSPIRONE HCL 10 MG PO TABS: 5.0000 mg | ORAL_TABLET | Freq: Three times a day (TID) | ORAL | Status: DC

## 2021-04-30 MED ORDER — ENOXAPARIN SODIUM 40 MG/0.4ML IJ SOSY: 40.0000 mg | PREFILLED_SYRINGE | INTRAMUSCULAR | Status: DC

## 2021-04-30 MED ORDER — SENNOSIDES-DOCUSATE SODIUM 8.6-50 MG PO TABS: 1.0000 | ORAL_TABLET | Freq: Every evening | ORAL | Status: DC | PRN

## 2021-04-30 MED ORDER — ASPIRIN EC 325 MG PO TBEC: 325.0000 mg | DELAYED_RELEASE_TABLET | Freq: Every day | ORAL | Status: DC

## 2021-04-30 MED ORDER — ASPIRIN EC 81 MG PO TBEC
81.0000 mg | DELAYED_RELEASE_TABLET | Freq: Every day | ORAL | Status: DC
  Administered 2021-05-01: 81 mg via ORAL
  Filled 2021-04-30: qty 1

## 2021-04-30 MED ORDER — ACETAMINOPHEN 325 MG PO TABS: 650.0000 mg | ORAL_TABLET | ORAL | Status: DC | PRN

## 2021-04-30 MED ORDER — ASPIRIN 81 MG PO CHEW
650.0000 mg | CHEWABLE_TABLET | Freq: Once | ORAL | Status: AC
  Administered 2021-04-30: 650 mg via ORAL
  Filled 2021-04-30: qty 9

## 2021-04-30 MED ORDER — ACETAMINOPHEN 160 MG/5ML PO SOLN: 650.0000 mg | ORAL | Status: DC | PRN

## 2021-04-30 MED ORDER — CLOPIDOGREL BISULFATE 75 MG PO TABS
75.0000 mg | ORAL_TABLET | Freq: Every day | ORAL | Status: DC
  Administered 2021-05-01: 75 mg via ORAL
  Filled 2021-04-30: qty 1

## 2021-04-30 NOTE — Consult Note (Addendum)
NEURO HOSPITALIST CONSULT NOTE   Requestig physician: Dr. Renaye Rakers  Reason for Consult:Acute onset of right sided sensory numbness  History obtained from:   Patient and Chart     HPI:                                                                                                                                          Steve Dickerson is an 40 y.o. male presenting from home for evaluation of new onset right sided numbness. Symptoms first noticed at 8 PM yesterday with right arm sensory numbness; mostly noticed to touch, but there were some paresthesias as well; entire right arm involved circumferentially up to the wrist, sparing the hand. The patient went to bed and on awakening this morning, he noticed, at about 8 AM, progression of the numbness to involve the lateral aspect of his RLE to mid-calf. He also had onset of numbness to portions of the right side of his face, mostly in the temporal, forehead and cheek region, as well as the ear and skull behind the ear. At that time, he started to notice a left frontotemporal headache which was mildly throbbing and rated as 3/10, with no sonophobia, photophobia or osmophobia.   He has no history of migraines.   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 He has never used smokeless tobacco. He reports current alcohol use. He reports that he does not use drugs.  No Known Allergies  MEDICATIONS:  No current facility-administered medications on file prior to encounter.   Current Outpatient Medications on File Prior to Encounter  Medication Sig Dispense Refill   busPIRone (BUSPAR) 5 MG tablet Take 1 tablet (5 mg total) by mouth 3 (three) times daily. 90 tablet 5   fluconazole (DIFLUCAN) 150 MG tablet TAKE 1 TABLET BY MOUTH ONCE A DAY 12 tablet 0   venlafaxine XR (EFFEXOR XR) 37.5 MG 24 hr capsule Take 1 capsule (37.5 mg total) by mouth daily with breakfast. 90 capsule 1   venlafaxine XR (EFFEXOR-XR) 37.5 MG 24 hr capsule TAKE 1 CAPSULE BY MOUTH ONCE A DAY WITH BREAKFAST 90 capsule 1     ROS:                                                                                                                                       Denies any vision changes, confusion, aphasia, dysarthria or left sided symptoms. He did feel unsteady on his feet this morning. Other ROS as per HPI.    Blood pressure (!) 168/118, pulse 80, temperature 98.6 F (37 C), temperature source Oral, resp. rate 16, SpO2 99 %.   General Examination:                                                                                                       Physical Exam  HEENT-  [place]/AT    Lungs- Respirations unlabored Extremities- No edema. Evidence for prior operations to feet/ankles (history of club foot)   Neurological Examination Mental Status: Alert, fully oriented, thought content appropriate.  Speech fluent without evidence of aphasia.  Able to follow all commands without difficulty. Cranial Nerves: II: Visual fields intact bilaterally. No extinction to DSS. PERRL.  III,IV, VI: No ptosis. EOMI. No nystagmus.  V: Temp sensation intact to V1, V2 and V3  bilaterally.  VII: Smile symmetric VIII: Hearing intact to conversation IX,X: No hoarseness or hypophonia XI: Symmetric shoulder shrug XII: Midline tongue extension Motor: RUE 4+/5 proximally and distally with some hesitancy and occasional give-way RLE 4+/5 HF and KE. 4+/5 ADF and APF LUE and LLE 5/5 No pronator drift BUE No drift to either lower extremity.  Tone normal. Muscle bulk decreased to bilateral lower legs below knees (history of club foot with prior operations) Sensory: Decreased temp and FT sensation to RUE and RLE throughout. Intermittent extinction to DSS on the right.  Deep Tendon Reflexes: 3+ bilateral biceps and brachioradialis. 4+ bilateral patellae (low amplitude clonus and  responses are abnormally brisk). Unable to elicit achilles reflexes. Toes down going bilaterally.  Cerebellar: Subtle ataxia on the right with FNF. Normal FNF on the left. H-S is normal bilaterally.  Gait: Deferred   Lab Results: Basic Metabolic Panel: Recent Labs  Lab 04/30/21 0908  NA 140  K 4.6  CL 102  GLUCOSE 102*  BUN 17  CREATININE 1.00    CBC: Recent Labs  Lab 04/30/21 0900 04/30/21 0908  WBC 9.5  --   NEUTROABS 6.7  --   HGB 17.6* 18.4*  HCT 52.9* 54.0*  MCV 90.0  --   PLT 386  --     Cardiac Enzymes: No results for input(s): CKTOTAL, CKMB, CKMBINDEX, TROPONINI in the last 168 hours.  Lipid Panel: No results for input(s): CHOL, TRIG, HDL, CHOLHDL, VLDL, LDLCALC in the last 168 hours.  Imaging: CT HEAD CODE STROKE WO CONTRAST  Result Date: 04/30/2021 CLINICAL DATA:  Code stroke.  Neuro deficit, acute, stroke suspected EXAM: CT HEAD WITHOUT CONTRAST TECHNIQUE: Contiguous axial images were obtained from the base of the skull through the vertex without intravenous contrast. COMPARISON:  None. FINDINGS: Brain: No evidence of acute large vascular territory infarction, hemorrhage, hydrocephalus, extra-axial collection or mass lesion/mass effect. Vascular: No hyperdense  vessel identified. Skull: No acute fracture. Sinuses/Orbits: No acute finding. Other: No mastoid effusions. ASPECTS [facility] Stroke Program Early CT Score) total score (0-10 with 10 being normal): 10. IMPRESSION: No evidence of acute large vascular territory infarct or acute hemorrhage. ASPECTS is 10. Code stroke imaging results were communicated on 04/30/2021 at 9:12 am to provider Dr. Otelia Limes via secure text paging. Electronically Signed   By: Feliberto Harts M.D.   On: 04/30/2021 09:14     Assessment: 40 year old male with acute onset of right sided sensory numbness 1. Neurological exam reveals findings best localizable to the left parietal and posterior frontal lobe.  2. CT head: No evidence of acute large vascular territory infarct or acute hemorrhage. ASPECTS is 10.  3. DDx: TIA is possible, but given lack of vascular risk factors as well as the semiology of his presentation, new-onset complicated migraine is felt to be more likely.   Recommendations: 1. STAT MRI brain with MRA head 2. If MRI brain is negative, treat with migraine cocktail 3. If MRI brain is positive, call Neurohospitalist STAT 4. After discharge, he will need outpatient Neurology follow up.   Addendum: - MRI brain reveals an acute small subcortical stroke adjacent to the postcentral and precentral gyri on the left.  - Full stroke work up to include TTE, carotid ultrasound, cardiac telemetry, HgbA1c and fasting lipid panel.  - Loading with ASA 650 mg x 1 now - Start DAPT with ASA and Plavix tomorrow. Continue DAPT for 21 days, then stop Plavix, continuing thereafter with ASA monotherapy.  - Hypercoagulable panel.    Electronically signed: Dr. Caryl Pina 04/30/2021, 9:29 AM

## 2021-04-30 NOTE — Progress Notes (Signed)
Pt would like to use SCDs instead of receiving Lovenox shots. I told him to ask the doctor in the morning since he gets up and about and the med isn't due until around 1000 tomorrow. Pt voiced understanding

## 2021-04-30 NOTE — H&P (Signed)
History and Physical    Steve Dickerson LJQ:[id number] DOB: 10-16-1980 DOA: 04/30/2021  PCP: Binnie Rail, MD (Confirm with patient/family/NH records and if not entered, this has to be entered at [facility] point of entry) Patient coming from: Home  I have personally briefly reviewed patient's old medical records in [place]  Chief Complaint: Right sided numbness  HPI: Steve Dickerson is a 40 y.o. male with no significant past medical history presented with new onset of right-sided numbness and clumsiness.  Patient started to feel right-sided numbness and " sluggish" yesterday evening, which involved right arm, right leg and right side of neck and part of the face.  Symptoms persisted this morning when he woke up, and slightly improved of the numbness of the right leg but numbness on the right arm persisted throughout the sluggishness.  No headache, no blurred changes.  Last year, patient had few episodes of palpitations and went to see PCP, and was found blood pressure borderline elevated but EKG showed sinus rhythm, work-up including thyroid function and ESR CRP all within normal limits and Echo showed a normal structural heart.  At baseline, patient been healthy, active soccer player   ED Course: Blood pressure elevated systolic 219X to 588T, diastolic 25Q to low 982M.  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  Review of Systems: As per HPI otherwise 14 point review of systems negative.    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 He has never used smokeless tobacco. He reports current alcohol use. He reports that he does not use drugs.  No Known Allergies  Family History  Problem Relation Age of Onset   Hypertension Mother    COPD Mother    Asthma Mother    Diabetes  Father    Hyperlipidemia Father    Hypertension Father    Alcohol abuse Father    Polycystic ovary syndrome Sister    ADD / ADHD Brother    Mental illness Maternal Aunt    Dementia Maternal Grandmother    Lymphoma Maternal Grandmother    Parkinson's disease Maternal Grandfather    Dementia Paternal Grandmother    Heart disease Paternal Grandfather        died of MI at 74     Prior to Admission medications   Medication Sig Start Date End Date Taking? Authorizing Provider  busPIRone (BUSPAR) 5 MG tablet Take 1 tablet (5 mg total) by mouth 3 (three) times daily. 04/29/20   Binnie Rail, MD  fluconazole (DIFLUCAN) 150 MG tablet TAKE 1 TABLET BY MOUTH ONCE A DAY 06/20/20 06/20/21  Gregor Hams, MD  venlafaxine XR (EFFEXOR XR) 37.5 MG 24 hr capsule Take 1 capsule (37.5 mg total) by mouth daily with breakfast. 07/29/20   Binnie Rail, MD  venlafaxine XR (EFFEXOR-XR) 37.5 MG 24 hr capsule TAKE 1 CAPSULE BY MOUTH ONCE A DAY WITH BREAKFAST 01/22/21 01/22/22  Binnie Rail, MD    Physical Exam: Vitals:   04/30/21 1245 04/30/21 1300 04/30/21 1315 04/30/21 1330  BP: (!) 147/97 (!) 147/89 (!) 144/99 (!) 148/93  Pulse: 81 79 72 72  Resp: 15 19 (!) 22 (!) 23  Temp:      TempSrc:  SpO2: 94% 92% 96% 95%    Constitutional: NAD, calm, comfortable Vitals:   04/30/21 1245 04/30/21 1300 04/30/21 1315 04/30/21 1330  BP: (!) 147/97 (!) 147/89 (!) 144/99 (!) 148/93  Pulse: 81 79 72 72  Resp: 15 19 (!) 22 (!) 23  Temp:      TempSrc:      SpO2: 94% 92% 96% 95%   Eyes: PERRL, lids and conjunctivae normal ENMT: Mucous membranes are moist. Posterior pharynx clear of any exudate or lesions.Normal dentition.  Neck: normal, supple, no masses, no thyromegaly Respiratory: clear to auscultation bilaterally, no wheezing, no crackles. Normal respiratory effort. No accessory muscle use.  Cardiovascular: Regular rate and rhythm, no murmurs / rubs / gallops. No extremity edema. 2+ pedal pulses. No  carotid bruits.  Abdomen: no tenderness, no masses palpated. No hepatosplenomegaly. Bowel sounds positive.  Musculoskeletal: no clubbing / cyanosis. No joint deformity upper and lower extremities. Good ROM, no contractures. Normal muscle tone.  Skin: no rashes, lesions, ulcers. No induration Neurologic: CN 2-12 grossly intact.  Decreased light touch sensation on the right arm compared to the left, right wrist and hand and fingers are spared., DTR normal. Strength 5/5 in all 4.  Psychiatric: Normal judgment and insight. Alert and oriented x 3. Normal mood.     Labs on Admission: I have personally reviewed following labs and imaging studies  CBC: Recent Labs  Lab 04/30/21 0900 04/30/21 0908  WBC 9.5  --   NEUTROABS 6.7  --   HGB 17.6* 18.4*  HCT 52.9* 54.0*  MCV 90.0  --   PLT 386  --    Basic Metabolic Panel: Recent Labs  Lab 04/30/21 0900 04/30/21 0908  NA 140 140  K 4.6 4.6  CL 103 102  CO2 28  --   GLUCOSE 104* 102*  BUN 14 17  CREATININE 1.06 1.00  CALCIUM 9.6  --    GFR: CrCl cannot be calculated (Unknown ideal weight.). Liver Function Tests: Recent Labs  Lab 04/30/21 0900  AST 28  ALT 26  ALKPHOS 78  BILITOT 0.7  PROT 7.4  ALBUMIN 4.3   No results for input(s): LIPASE, AMYLASE in the last 168 hours. No results for input(s): AMMONIA in the last 168 hours. Coagulation Profile: Recent Labs  Lab 04/30/21 0900  INR 1.0   Cardiac Enzymes: No results for input(s): CKTOTAL, CKMB, CKMBINDEX, TROPONINI in the last 168 hours. BNP (last 3 results) No results for input(s): PROBNP in the last 8760 hours. HbA1C: No results for input(s): HGBA1C in the last 72 hours. CBG: Recent Labs  Lab 04/30/21 0857  GLUCAP 111*   Lipid Profile: No results for input(s): CHOL, HDL, LDLCALC, TRIG, CHOLHDL, LDLDIRECT in the last 72 hours. Thyroid Function Tests: No results for input(s): TSH, T4TOTAL, FREET4, T3FREE, THYROIDAB in the last 72 hours. Anemia Panel: No  results for input(s): VITAMINB12, FOLATE, FERRITIN, TIBC, IRON, RETICCTPCT in the last 72 hours. Urine analysis:    Component Value Date/Time   COLORURINE YELLOW 02/08/2018 1225   APPEARANCEUR CLEAR 02/08/2018 1225   LABSPEC 1.010 02/08/2018 1225   PHURINE 6.5 02/08/2018 1225   GLUCOSEU NEGATIVE 02/08/2018 1225   HGBUR NEGATIVE 02/08/2018 1225   BILIRUBINUR NEGATIVE 02/08/2018 1225   KETONESUR NEGATIVE 02/08/2018 1225   UROBILINOGEN 0.2 02/08/2018 1225   NITRITE NEGATIVE 02/08/2018 1225   LEUKOCYTESUR NEGATIVE 02/08/2018 1225    Radiological Exams on Admission: MR ANGIO HEAD WO CONTRAST  Result Date: 04/30/2021 CLINICAL DATA:  40 year old male presenting with  new onset right-sided numbness EXAM: MRI HEAD WITHOUT CONTRAST MRA HEAD WITHOUT CONTRAST TECHNIQUE: Multiplanar, multi-echo pulse sequences of the brain and surrounding structures were acquired without intravenous contrast. Angiographic images of the Circle of Willis were acquired using MRA technique without intravenous contrast. COMPARISON:  Same-day noncontrast head CT. FINDINGS: MRI HEAD FINDINGS Brain: There is a small focus of patchy diffusion restriction in the left centrum semiovale with associated faint FLAIR signal Vascular: The major flow voids are present. The vasculature is assessed below. Skull and upper cervical spine: Normal marrow signal. Sinuses/Orbits: There is mild mucosal thickening in the paranasal sinuses with a right maxillary sinus mucous retention cyst. The globes and orbits are unremarkable. Other: None. MRA HEAD FINDINGS Anterior circulation: The intracranial ICAs are patent. The bilateral MCAs are patent. The bilateral ACAs are patent. There is no aneurysm. Posterior circulation: The bilateral V4 segments are patent. The basilar artery is patent. The bilateral PCAs are patent. The left posterior communicating artery is not identified. There is a prominent right posterior communicating artery There is no aneurysm.  Anatomic variants: As above. IMPRESSION: 1. 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 2. Patent intracranial vasculature. No high-grade stenosis or occlusion. Electronically Signed   By: Valetta Mole M.D.   On: 04/30/2021 10:16   MR BRAIN WO CONTRAST  Result Date: 04/30/2021 CLINICAL DATA:  40 year old male presenting with new onset right-sided numbness EXAM: MRI HEAD WITHOUT CONTRAST MRA HEAD WITHOUT CONTRAST TECHNIQUE: Multiplanar, multi-echo pulse sequences of the brain and surrounding structures were acquired without intravenous contrast. Angiographic images of the Circle of Willis were acquired using MRA technique without intravenous contrast. COMPARISON:  Same-day noncontrast head CT. FINDINGS: MRI HEAD FINDINGS Brain: There is a small focus of patchy diffusion restriction in the left centrum semiovale with associated faint FLAIR signal Vascular: The major flow voids are present. The vasculature is assessed below. Skull and upper cervical spine: Normal marrow signal. Sinuses/Orbits: There is mild mucosal thickening in the paranasal sinuses with a right maxillary sinus mucous retention cyst. The globes and orbits are unremarkable. Other: None. MRA HEAD FINDINGS Anterior circulation: The intracranial ICAs are patent. The bilateral MCAs are patent. The bilateral ACAs are patent. There is no aneurysm. Posterior circulation: The bilateral V4 segments are patent. The basilar artery is patent. The bilateral PCAs are patent. The left posterior communicating artery is not identified. There is a prominent right posterior communicating artery There is no aneurysm. Anatomic variants: As above. IMPRESSION: 1. 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 2. Patent intracranial vasculature. No high-grade stenosis or occlusion. Electronically Signed   By: Valetta Mole M.D.   On: 04/30/2021 10:16   CT HEAD CODE STROKE WO CONTRAST  Result Date: 04/30/2021 CLINICAL  DATA:  Code stroke.  Neuro deficit, acute, stroke suspected EXAM: CT HEAD WITHOUT CONTRAST TECHNIQUE: Contiguous axial images were obtained from the base of the skull through the vertex without intravenous contrast. COMPARISON:  None. FINDINGS: Brain: No evidence of acute large vascular territory infarction, hemorrhage, hydrocephalus, extra-axial collection or mass lesion/mass effect. Vascular: No hyperdense vessel identified. Skull: No acute fracture. Sinuses/Orbits: No acute finding. Other: No mastoid effusions. ASPECTS [facility] Stroke Program Early CT Score) total score (0-10 with 10 being normal): 10. IMPRESSION: No evidence of acute large vascular territory infarct or acute hemorrhage. ASPECTS is 10. Code stroke imaging results were communicated on 04/30/2021 at 9:12 am to provider Dr. Cheral Marker via secure text paging. Electronically Signed  By: Margaretha Sheffield M.D.   On: 04/30/2021 09:14    EKG: Independently reviewed.  Normal sinus rhythm, no PR or QTC interval changes  Assessment/Plan Active Problems:   CVA (cerebral vascular accident) ([place])  (please populate well all problems here in Problem List. (For example, if patient is on BP meds at home and you resume or decide to hold them, it is a problem that needs to be her. Same for CAD, COPD, HLD and so on)  Right-sided paresthesia -Secondary to left centrum semiovale acute/subacute infarct -Risk factor might be underlying HTN, and given last years episode of palpitation, so need to rule out A. Fib. -MRI negative for major stenosis. -Carotid doppler -ASA -A1C and lipid panel.  HTN -Allow permissive hypertension, will start as needed hydralazine for 200/110  Anxiety/depression -Continue SSRI.  DVT prophylaxis: Lovenox Code Status: Full Code Family Communication: None at bedside Disposition Plan: Back less than 2 midnight hospital stay Consults called: Neuro Admission status: Tele obs   Lequita Halt MD [facility] Pager  [phone]  04/30/2021, 1:41 PM

## 2021-04-30 NOTE — Progress Notes (Signed)
Subjective:    Patient ID: Steve Dickerson, male    DOB: [birth year], 40 y.o.   MRN: [id number]  This visit occurred during the SARS-CoV-2 public health emergency.  Safety protocols were in place, including screening questions prior to the visit, additional usage of staff PPE, and extensive cleaning of exam room while observing appropriate contact time as indicated for disinfecting solutions.    HPI The patient is here for an acute visit.  Last night at dinner Steve Dickerson noticed numbness/decreased sensation in his right proximal UE.  It extended to his distal arm prior to going to bed.  This morning Steve Dickerson noticed  his left leg was numb as well.  Steve Dickerson denies weakness, but feels like his proprioception is off and there is a delay in his right sided movement/strength.  No facial numbness.  Mild left sided headache.    Medications and allergies reviewed with patient and updated if appropriate.  Patient Active Problem List   Diagnosis Date Noted   Childhood asthma 04/03/2020   Palpitations 04/03/2020   Sensation of chest pressure 04/03/2020   Onychomycosis 09/26/2019    Current Outpatient Medications on File Prior to Visit  Medication Sig Dispense Refill   busPIRone (BUSPAR) 5 MG tablet Take 1 tablet (5 mg total) by mouth 3 (three) times daily. 90 tablet 5   fluconazole (DIFLUCAN) 150 MG tablet TAKE 1 TABLET BY MOUTH ONCE A DAY 12 tablet 0   venlafaxine XR (EFFEXOR XR) 37.5 MG 24 hr capsule Take 1 capsule (37.5 mg total) by mouth daily with breakfast. 90 capsule 1   venlafaxine XR (EFFEXOR-XR) 37.5 MG 24 hr capsule TAKE 1 CAPSULE BY MOUTH ONCE A DAY WITH BREAKFAST 90 capsule 1   No current facility-administered medications on file prior to visit.    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    Social History   Socioeconomic History   Marital status: Married     Spouse name: Not on file   Number of children: 2   Years of education: Not on file   Highest education level: Not on file  Occupational History   Not on file  Tobacco Use   Smoking status: Never   Smokeless tobacco: Never  Substance and Sexual Activity   Alcohol use: Yes    Comment: social   Drug use: No   Sexual activity: Not on file  Other Topics Concern   Not on file  Social History Narrative   Married, 2 kids   Exercises regularly   Social Determinants of Health   Financial Resource Strain: Not on file  Food Insecurity: Not on file  Transportation Needs: Not on file  Physical Activity: Not on file  Stress: Not on file  Social Connections: Not on file    Family History  Problem Relation Age of Onset   Hypertension Mother    COPD Mother    Asthma Mother    Diabetes Father    Hyperlipidemia Father    Hypertension Father    Alcohol abuse Father    Polycystic ovary syndrome Sister    ADD / ADHD Brother    Mental illness Maternal Aunt    Dementia Maternal Grandmother    Lymphoma Maternal Grandmother    Parkinson's disease Maternal Grandfather    Dementia Paternal Grandmother    Heart  disease Paternal Grandfather        died of MI at 41    Review of Systems  Constitutional:  Negative for chills and fever.  HENT:  Negative for trouble swallowing.   Eyes:  Negative for visual disturbance.  Respiratory:  Negative for cough, shortness of breath and wheezing.   Cardiovascular:  Negative for chest pain and palpitations.  Neurological:  Positive for numbness and headaches. Negative for dizziness, facial asymmetry, speech difficulty and weakness.      Objective:   Vitals:   04/30/21 0756  BP: (!) 148/92  Pulse: 76  Temp: 98.1 F (36.7 C)  SpO2: 96%   BP Readings from Last 3 Encounters:  04/30/21 (!) 148/92  04/03/20 128/82  11/30/19 138/84   Wt Readings from Last 3 Encounters:  11/30/19 170 lb (77.1 kg)  02/08/18 169 lb (76.7 kg)  07/15/17 169 lb  (76.7 kg)   There is no height or weight on file to calculate BMI.   Physical Exam Constitutional:      General: Steve Dickerson is not in acute distress.    Appearance: Normal appearance. Steve Dickerson is not ill-appearing.  HENT:     Head: Normocephalic and atraumatic.  Eyes:     Extraocular Movements: Extraocular movements intact.     Conjunctiva/sclera: Conjunctivae normal.  Cardiovascular:     Rate and Rhythm: Normal rate and regular rhythm.     Heart sounds: No murmur heard. Pulmonary:     Effort: Pulmonary effort is normal. No respiratory distress.     Breath sounds: No wheezing or rales.  Musculoskeletal:     Right lower leg: No edema.     Left lower leg: No edema.  Skin:    General: Skin is warm and dry.  Neurological:     Mental Status: Steve Dickerson is alert and oriented to person, place, and time.     Cranial Nerves: No cranial nerve deficit.     Sensory: Sensory deficit (decreased left arm, left leg, left face ( new to him now)) present.     Motor: Weakness (right upper and lower extremity 4/5) present.     Coordination: Coordination normal.     Gait: Gait normal.  Psychiatric:        Mood and Affect: Mood normal.           Assessment & Plan:    I spent 30 minutes dedicated to the care of this patient on the date of this encounter including obtaining history, communicating with the patient, discussing with specialist - neurology, ordering tests, and documenting clinical information in the EHR   Right arm and leg weakness, left face, arm and leg numbness: Acute Did not want to go to ED Stat CT scan After speaking with neurology - advised needs to go to ED for stat MRI   Elevated BP w/o htn: BP elevated here today - typically controlled Given current symptoms no treatment needed Recheck  in ED  Further evaluation and treatment in ED

## 2021-04-30 NOTE — ED Triage Notes (Signed)
Pt went to bed with numbness starting in his right arm. No weakness. This morning, within the last hour, pt noticed numbness in right leg and into right neck/face. No weakness with this. Pt has left sided headache. No facial droop. Code stroke called in triage. Pt taken back

## 2021-04-30 NOTE — ED Notes (Signed)
Patient remains in MRI 

## 2021-04-30 NOTE — Progress Notes (Signed)
VASCULAR LAB    Carotid has been performed.  See CV proc for preliminary results.   Sam Wunschel, RVT 04/30/2021, 4:05 PM

## 2021-04-30 NOTE — Code Documentation (Signed)
Stroke Response Nurse Documentation Code Documentation  Steve Dickerson is a 40 y.o. male arriving to [facility] ED via [facility] on 04/30/21 with no significant past medical history. Takes Effexor daily.   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 He noted right shoulder numbness prior to going to bed. This morning he still noted the right shoulder numbness. Preceded to work out and go to work ([employer] provider) where he reports the numbness was additionally  noted in right leg and face. He also complains of left sided headache.Code stroke was activated by ED.  Labs drawn and patient cleared for CT by Dr. Gibson Ramp. Patient to CT with team. Stroke team met patient in CT. NIHSS 2, see documentation for details and code stroke times. Patient with right limb ataxia and right decreased sensation on exam. The following imaging was completed: CT, MRI. Patient is not a candidate for IV Thrombolytic due to out of window. Patient is not a candidate for IR due to LVO negative.   Care/Plan: admit for stroke workup; Q2 VS and NIHs.   Bedside handoff with ED RN Claiborne Billings.    Leverne Humbles Stroke Response RN

## 2021-04-30 NOTE — ED Provider Notes (Signed)
[facility] EMERGENCY DEPARTMENT Provider Note   CSN: [id number] Arrival date & time: 04/30/21  0623  An emergency department physician performed an initial assessment on this suspected stroke patient at 27.  History CC:  Right sided paresthesias   Steve Dickerson is a 40 y.o. male presenting with right sided paresthesias.  He reports last night around 7-8pm noticing "numbness" in his right shoulder.  This morning around 8 am he noticed numbness extending to the right lower leg and right side of his face.  He reports a mild left sided headache onset this morning.  No slurred speech or evident weakness.  No hx of stroke, TIA, arrhythmias.  He reports he takes only Effexor, no other medications.  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       Family History  Problem Relation Age of Onset   Hypertension Mother    COPD Mother    Asthma Mother    Diabetes Father    Hyperlipidemia Father    Hypertension Father    Alcohol abuse Father    Polycystic ovary syndrome Sister    ADD / ADHD Brother    Mental illness Maternal Aunt    Dementia Maternal Grandmother    Lymphoma Maternal Grandmother    Parkinson's disease Maternal Grandfather    Dementia Paternal Grandmother    Heart disease Paternal Grandfather        died of MI at 59    Social History   Tobacco Use   Smoking status: Never   Smokeless tobacco: Never  Substance Use Topics   Alcohol use: Yes    Comment: social   Drug use: No    Home Medications Prior to Admission medications   Medication  Sig Start Date End Date Taking? Authorizing Provider  busPIRone (BUSPAR) 5 MG tablet Take 1 tablet (5 mg total) by mouth 3 (three) times daily. 04/29/20   Pincus Sanes, MD  fluconazole (DIFLUCAN) 150 MG tablet TAKE 1 TABLET BY MOUTH ONCE A DAY 06/20/20 06/20/21  Rodolph Bong, MD  venlafaxine XR (EFFEXOR XR) 37.5 MG 24 hr capsule Take 1 capsule (37.5 mg total) by mouth daily with breakfast. 07/29/20   Pincus Sanes, MD  venlafaxine XR (EFFEXOR-XR) 37.5 MG 24 hr capsule TAKE 1 CAPSULE BY MOUTH ONCE A DAY WITH BREAKFAST 01/22/21 01/22/22  Pincus Sanes, MD    Allergies    Patient has no known allergies.  Review of Systems   Review of Systems  Constitutional:  Negative for chills and fever.  Respiratory:  Negative for cough and shortness of breath.   Cardiovascular:  Negative for chest pain and palpitations.  Gastrointestinal:  Negative for abdominal pain and vomiting.  Musculoskeletal:  Negative for arthralgias and back pain.  Skin:  Negative for color change and rash.  Neurological:  Positive for facial asymmetry, numbness and headaches. Negative for dizziness, syncope and speech difficulty.  All other systems reviewed and are negative.  Physical Exam Updated Vital Signs BP (!) 148/93   Pulse  72   Temp 98.6 F (37 C) (Oral)   Resp (!) 23   SpO2 95%   Physical Exam Constitutional:      General: He is not in acute distress. HENT:     Head: Normocephalic and atraumatic.  Eyes:     General: No visual field deficit.    Conjunctiva/sclera: Conjunctivae normal.     Pupils: Pupils are equal, round, and reactive to light.  Cardiovascular:     Rate and Rhythm: Normal rate and regular rhythm.  Pulmonary:     Effort: Pulmonary effort is normal. No respiratory distress.  Abdominal:     General: There is no distension.     Tenderness: There is no abdominal tenderness.  Skin:    General: Skin is warm and dry.  Neurological:     General: No focal deficit present.     Mental Status:  He is alert and oriented to person, place, and time. Mental status is at baseline.     GCS: GCS eye subscore is 4. GCS verbal subscore is 5. GCS motor subscore is 6.     Cranial Nerves: Cranial nerves 2-12 are intact. No cranial nerve deficit, dysarthria or facial asymmetry.     Motor: Motor function is intact. No weakness, tremor or pronator drift.     Coordination: Coordination is intact. Finger-Nose-Finger Test normal.     Comments: Reports paresthesias right arm, right leg, right forehead  Psychiatric:        Mood and Affect: Mood normal.        Behavior: Behavior normal.    ED Results / Procedures / Treatments   Labs (all labs ordered are listed, but only abnormal results are displayed) Labs Reviewed  CBC - Abnormal; Notable for the following components:      Result Value   RBC 5.88 (*)    Hemoglobin 17.6 (*)    HCT 52.9 (*)    All other components within normal limits  COMPREHENSIVE METABOLIC PANEL - Abnormal; Notable for the following components:   Glucose, Bld 104 (*)    All other components within normal limits  I-STAT CHEM 8, ED - Abnormal; Notable for the following components:   Glucose, Bld 102 (*)    Hemoglobin 18.4 (*)    HCT 54.0 (*)    All other components within normal limits  CBG MONITORING, ED - Abnormal; Notable for the following components:   Glucose-Capillary 111 (*)    All other components within normal limits  RESP PANEL BY RT-PCR (FLU A&B, COVID) ARPGX2  ETHANOL  PROTIME-INR  APTT  DIFFERENTIAL  URINALYSIS, ROUTINE W REFLEX MICROSCOPIC  HIV ANTIBODY (ROUTINE TESTING W REFLEX)  TROPONIN I (HIGH SENSITIVITY)    EKG EKG Interpretation  Date/Time:  Thursday April 30 2021 10:34:55 EDT Ventricular Rate:  81 PR Interval:  136 QRS Duration: 97 QT Interval:  360 QTC Calculation: 418 R Axis:   89 Text Interpretation: Sinus rhythm Consider right atrial enlargement Confirmed by Alvester Chou [id number]) on 04/30/2021 11:07:33 AM  Radiology MR ANGIO  HEAD WO CONTRAST  Result Date: 04/30/2021 CLINICAL DATA:  40 year old male presenting with new onset right-sided numbness EXAM: MRI HEAD WITHOUT CONTRAST MRA HEAD WITHOUT CONTRAST TECHNIQUE: Multiplanar, multi-echo pulse sequences of the brain and surrounding structures were acquired without intravenous contrast. Angiographic images of the Circle of Willis were acquired using MRA technique without intravenous contrast. COMPARISON:  Same-day noncontrast head CT. FINDINGS: MRI HEAD FINDINGS Brain: There is a small focus of patchy diffusion restriction in  the left centrum semiovale with associated faint FLAIR signal Vascular: The major flow voids are present. The vasculature is assessed below. Skull and upper cervical spine: Normal marrow signal. Sinuses/Orbits: There is mild mucosal thickening in the paranasal sinuses with a right maxillary sinus mucous retention cyst. The globes and orbits are unremarkable. Other: None. MRA HEAD FINDINGS Anterior circulation: The intracranial ICAs are patent. The bilateral MCAs are patent. The bilateral ACAs are patent. There is no aneurysm. Posterior circulation: The bilateral V4 segments are patent. The basilar artery is patent. The bilateral PCAs are patent. The left posterior communicating artery is not identified. There is a prominent right posterior communicating artery There is no aneurysm. Anatomic variants: As above. IMPRESSION: 1. 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 2. Patent intracranial vasculature. No high-grade stenosis or occlusion. Electronically Signed   By: Lesia Hausen M.D.   On: 04/30/2021 10:16   MR BRAIN WO CONTRAST  Result Date: 04/30/2021 CLINICAL DATA:  41 year old male presenting with new onset right-sided numbness EXAM: MRI HEAD WITHOUT CONTRAST MRA HEAD WITHOUT CONTRAST TECHNIQUE: Multiplanar, multi-echo pulse sequences of the brain and surrounding structures were acquired without intravenous contrast. Angiographic  images of the Circle of Willis were acquired using MRA technique without intravenous contrast. COMPARISON:  Same-day noncontrast head CT. FINDINGS: MRI HEAD FINDINGS Brain: There is a small focus of patchy diffusion restriction in the left centrum semiovale with associated faint FLAIR signal Vascular: The major flow voids are present. The vasculature is assessed below. Skull and upper cervical spine: Normal marrow signal. Sinuses/Orbits: There is mild mucosal thickening in the paranasal sinuses with a right maxillary sinus mucous retention cyst. The globes and orbits are unremarkable. Other: None. MRA HEAD FINDINGS Anterior circulation: The intracranial ICAs are patent. The bilateral MCAs are patent. The bilateral ACAs are patent. There is no aneurysm. Posterior circulation: The bilateral V4 segments are patent. The basilar artery is patent. The bilateral PCAs are patent. The left posterior communicating artery is not identified. There is a prominent right posterior communicating artery There is no aneurysm. Anatomic variants: As above. IMPRESSION: 1. 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 2. Patent intracranial vasculature. No high-grade stenosis or occlusion. Electronically Signed   By: Lesia Hausen M.D.   On: 04/30/2021 10:16   CT HEAD CODE STROKE WO CONTRAST  Result Date: 04/30/2021 CLINICAL DATA:  Code stroke.  Neuro deficit, acute, stroke suspected EXAM: CT HEAD WITHOUT CONTRAST TECHNIQUE: Contiguous axial images were obtained from the base of the skull through the vertex without intravenous contrast. COMPARISON:  None. FINDINGS: Brain: No evidence of acute large vascular territory infarction, hemorrhage, hydrocephalus, extra-axial collection or mass lesion/mass effect. Vascular: No hyperdense vessel identified. Skull: No acute fracture. Sinuses/Orbits: No acute finding. Other: No mastoid effusions. ASPECTS [facility] Stroke Program Early CT Score) total score (0-10 with 10  being normal): 10. IMPRESSION: No evidence of acute large vascular territory infarct or acute hemorrhage. ASPECTS is 10. Code stroke imaging results were communicated on 04/30/2021 at 9:12 am to provider Dr. Otelia Limes via secure text paging. Electronically Signed   By: Feliberto Harts M.D.   On: 04/30/2021 09:14    Procedures Procedures   Medications Ordered in ED Medications  busPIRone (BUSPAR) tablet 5 mg (has no administration in time range)  venlafaxine XR (EFFEXOR-XR) 24 hr capsule 37.5 mg (has no administration in time range)   stroke: mapping our early stages of recovery book (has no administration in time range)  acetaminophen (TYLENOL) tablet 650 mg (has no administration in time range)    Or  acetaminophen (TYLENOL) 160 MG/5ML solution 650 mg (has no administration in time range)    Or  acetaminophen (TYLENOL) suppository 650 mg (has no administration in time range)  senna-docusate (Senokot-S) tablet 1 tablet (has no administration in time range)  enoxaparin (LOVENOX) injection 40 mg (has no administration in time range)  hydrALAZINE (APRESOLINE) tablet 25 mg (has no administration in time range)  aspirin EC tablet 325 mg (has no administration in time range)  aspirin chewable tablet 650 mg (650 mg Oral Given 04/30/21 1056)    ED Course  I have reviewed the triage vital signs and the nursing notes.  Pertinent labs & imaging results that were available during my care of the patient were reviewed by me and considered in my medical decision making (see chart for details).  Differential diagnosis for the patient's presentation includes TIA versus complex migraine versus other.  Patient was activated as a code stroke on arrival based on the evolution of his symptoms with new onset deficits this morning at 8 AM, right-sided facial and right-sided leg paresthesia.  He does not have evidence of large vessel occlusion on my exam.  He has no motor deficits.  Neurology was consulted  presented at bedside, as noted below, reviewed the patient CT imaging and recommended MRI imaging.  Differential would include complex migraine if MRI imaging is negative.  I personally reviewed and interpreted the patient's labs.  CMP and CBC largely unremarkable.  Patient is reporting very mild left-sided headache, however he has no photophobia, no fever, no neck stiffness to suggest meningismus.  I doubt meningitis or CNS infection.  There is no confusion or encephalopathy.  I reviewed and interpreted the patient's EKG which shows normal sinus rhythm with no acute ischemic findings.  No evidence of acute arrhythmia   Clinical Course as of 04/30/21 1400  Thu Apr 30, 2021  0932 Neurologist recommending MRI, CTH negative for acute infarct. [MT]  0956 At MRI [MT]  1017 Trop undetectable with symptom onset last night, unlikely ACS [MT]  1032 MR BRAIN WO CONTRAST [RA]  1048 Discussed diagnosis on MRI with the patient for small acute to subacute infarct.  Neurology has ordered a loading dose of aspirin and recommends hospitalization for stroke work-up.  Patient is agreeable with this.  He remains with minimal, unchanged symptoms. [MT]  1048 Will page for admission [MT]  1313 Admitted to Dr Chipper Herb hospitalist [MT]    Clinical Course User Index [MT] Renaye Rakers Kermit Balo, MD [RA] Stephania Fragmin    Final Clinical Impression(s) / ED Diagnoses Final diagnoses:  Cerebrovascular accident (CVA), unspecified mechanism [facility])    Rx / DC Orders ED Discharge Orders     None        Renaye Rakers Kermit Balo, MD 04/30/21 1400

## 2021-05-01 ENCOUNTER — Other Ambulatory Visit (HOSPITAL_COMMUNITY): Payer: Self-pay

## 2021-05-01 ENCOUNTER — Observation Stay (HOSPITAL_BASED_OUTPATIENT_CLINIC_OR_DEPARTMENT_OTHER): Payer: 59

## 2021-05-01 DIAGNOSIS — Q2112 Patent foramen ovale: Secondary | ICD-10-CM

## 2021-05-01 DIAGNOSIS — I10 Essential (primary) hypertension: Secondary | ICD-10-CM | POA: Diagnosis not present

## 2021-05-01 DIAGNOSIS — R030 Elevated blood-pressure reading, without diagnosis of hypertension: Secondary | ICD-10-CM

## 2021-05-01 DIAGNOSIS — I639 Cerebral infarction, unspecified: Secondary | ICD-10-CM

## 2021-05-01 DIAGNOSIS — Z20822 Contact with and (suspected) exposure to covid-19: Secondary | ICD-10-CM | POA: Diagnosis not present

## 2021-05-01 DIAGNOSIS — Z79899 Other long term (current) drug therapy: Secondary | ICD-10-CM | POA: Diagnosis not present

## 2021-05-01 DIAGNOSIS — J45909 Unspecified asthma, uncomplicated: Secondary | ICD-10-CM | POA: Diagnosis not present

## 2021-05-01 DIAGNOSIS — I6389 Other cerebral infarction: Secondary | ICD-10-CM

## 2021-05-01 LAB — ECHOCARDIOGRAM COMPLETE
AR max vel: 2.51 cm2
AV Area VTI: 2.38 cm2
AV Area mean vel: 2.38 cm2
AV Mean grad: 4 mmHg
AV Peak grad: 7.3 mmHg
Ao pk vel: 1.35 m/s
Area-P 1/2: 4.39 cm2
S' Lateral: 2.9 cm

## 2021-05-01 LAB — URINALYSIS, ROUTINE W REFLEX MICROSCOPIC
Bacteria, UA: NONE SEEN
Bilirubin Urine: NEGATIVE
Glucose, UA: NEGATIVE mg/dL
Ketones, ur: NEGATIVE mg/dL
Leukocytes,Ua: NEGATIVE
Nitrite: NEGATIVE
Protein, ur: NEGATIVE mg/dL
Specific Gravity, Urine: 1.008 (ref 1.005–1.030)
pH: 7 (ref 5.0–8.0)

## 2021-05-01 LAB — RAPID URINE DRUG SCREEN, HOSP PERFORMED
Amphetamines: NOT DETECTED
Barbiturates: NOT DETECTED
Benzodiazepines: NOT DETECTED
Cocaine: NOT DETECTED
Opiates: NOT DETECTED
Tetrahydrocannabinol: NOT DETECTED

## 2021-05-01 LAB — HEMOGLOBIN A1C
Hgb A1c MFr Bld: 5.5 % (ref 4.8–5.6)
Mean Plasma Glucose: 111.15 mg/dL

## 2021-05-01 LAB — LIPID PANEL
Cholesterol: 235 mg/dL — ABNORMAL HIGH (ref 0–200)
HDL: 59 mg/dL (ref >40)
LDL Cholesterol: 158 mg/dL — ABNORMAL HIGH (ref 0–99)
Total CHOL/HDL Ratio: 4 RATIO
Triglycerides: 90 mg/dL (ref <150)
VLDL: 18 mg/dL (ref 0–40)

## 2021-05-01 LAB — HIV ANTIBODY (ROUTINE TESTING W REFLEX): HIV Screen 4th Generation wRfx: NONREACTIVE

## 2021-05-01 LAB — TSH: TSH: 4.203 u[IU]/mL (ref 0.350–4.500)

## 2021-05-01 LAB — RPR: RPR Ser Ql: NONREACTIVE

## 2021-05-01 LAB — VITAMIN B12: Vitamin B-12: 380 pg/mL (ref 180–914)

## 2021-05-01 MED ORDER — CLOPIDOGREL BISULFATE 75 MG PO TABS
75.0000 mg | ORAL_TABLET | Freq: Every day | ORAL | 3 refills | Status: DC
  Filled 2021-05-01 – 2021-05-22 (×2): qty 30, 30d supply, fill #0
  Filled 2021-05-25 – 2021-06-21 (×2): qty 30, 30d supply, fill #1

## 2021-05-01 MED ORDER — ASPIRIN 81 MG PO TBEC
81.0000 mg | DELAYED_RELEASE_TABLET | Freq: Every day | ORAL | 11 refills | Status: DC
  Filled 2021-05-01 – 2021-05-22 (×2): qty 30, 30d supply, fill #0
  Filled 2021-06-21: qty 30, 30d supply, fill #1
  Filled 2021-07-19: qty 30, 30d supply, fill #2
  Filled 2021-08-22: qty 30, 30d supply, fill #3
  Filled 2021-09-24: qty 30, 30d supply, fill #4
  Filled 2021-10-26: qty 30, 30d supply, fill #5
  Filled 2021-11-28: qty 30, 30d supply, fill #6

## 2021-05-01 MED ORDER — ROSUVASTATIN CALCIUM 20 MG PO TABS
20.0000 mg | ORAL_TABLET | Freq: Every day | ORAL | 11 refills | Status: DC
  Filled 2021-05-01 – 2021-05-22 (×2): qty 30, 30d supply, fill #0
  Filled 2021-05-25 – 2021-06-21 (×2): qty 30, 30d supply, fill #1
  Filled 2021-07-19: qty 30, 30d supply, fill #2
  Filled 2021-08-22: qty 30, 30d supply, fill #3
  Filled 2021-09-24: qty 30, 30d supply, fill #4
  Filled 2021-10-26: qty 30, 30d supply, fill #5
  Filled 2021-11-28: qty 30, 30d supply, fill #6

## 2021-05-01 NOTE — Evaluation (Signed)
Physical Therapy Evaluation Patient Details Name: Steve Dickerson MRN: [id number] DOB: Nov 08, 1980 Today's Date: 05/01/2021  History of Present Illness  Patient is a 40 y/o male who presents on 10/21 with new onset of right-sided numbness/sensory changes and clumsiness. Brain MRI- small subcortical stroke adjacent to the postcentral and precentral gyri on the left. No PMH.  Clinical Impression  Patient presents with impaired sensation on right side, headache and mild gait deviations s/p above. 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 Today, pt tolerated transfers and gait training Mod I-independent without evidence of difficulty or imbalance. Noted to have mild trendelenburg on right during gait but this did not effect balance. Tolerated stairs, change in gait speed, walking backwards and higher level balance activities without difficulty. Pt aware of [employer]. Headache did not worsen with activity. Pt functioning close to baseline despite above symptoms and does not require further skilled therapy services. Encouraged walking while in hospital and monitoring for changes in symptoms. Not a fall risk. Discharge from therapy.       Recommendations for follow up therapy are one component of a multi-disciplinary discharge planning process, led by the attending physician.  Recommendations may be updated based on patient status, additional functional criteria and insurance authorization.  Follow Up Recommendations No PT follow up    Equipment Recommendations  None recommended by PT    Recommendations for Other Services       Precautions / Restrictions Precautions Precautions: None Restrictions Weight Bearing Restrictions: No      Mobility  Bed Mobility Overal bed mobility: Independent                  Transfers Overall transfer level: Independent Equipment used: None             General transfer comment: No difficulty standing from EOB without  UEs,  Ambulation/Gait Ambulation/Gait assistance: Independent Gait Distance (Feet): 450 Feet Assistive device: None Gait Pattern/deviations: Antalgic   Gait velocity interpretation: >2.62 ft/sec, indicative of community ambulatory General Gait Details: Steady gait with antalgic like pattern; reports impaired sensation right glutes resulting in some instability during push off, no evidence of imbalance, Decreased DF Bil feet-premorbid.  Stairs Stairs: Yes Stairs assistance: Independent Stair Management: Alternating pattern Number of Stairs: 13 General stair comments: Some mild difficulty descending esp through RLE but alternating step pattern to ascend steps without rail.  Wheelchair Mobility    Modified Rankin (Stroke Patients Only) Modified Rankin (Stroke Patients Only) Pre-Morbid Rankin Score: No symptoms Modified Rankin: No significant disability     Balance Overall balance assessment: Modified Independent;Independent                               Standardized Balance Assessment Standardized Balance Assessment : Dynamic Gait Index   Dynamic Gait Index Level Surface: Mild Impairment Change in Gait Speed: Normal Gait and Pivot Turn: Normal Step Over Obstacle: Normal Step Around Obstacles: Normal Steps: Normal       Pertinent Vitals/Pain Pain Assessment: 0-10 Pain Score: 4  Pain Location: headache Pain Descriptors / Indicators: Headache Pain Intervention(s): Monitored during session    Home Living Family/patient expects to be discharged to:: Private residence Living Arrangements: Spouse/significant other;Children (8 amd 5 year olds) Available Help at Discharge: Family;Available PRN/intermittently Type of Home: House Home Access: Stairs to enter Entrance Stairs-Rails: Right;Can reach both;Left Entrance Stairs-Number of Steps: 4 Home Layout: Multi-level Home Equipment: None  Prior Function Level of Independence: Independent          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 decreased DF AROM-premorbid. RLE Sensation: decreased light touch    Cervical / Trunk Assessment Cervical / Trunk Assessment: Normal  Communication   Communication: No difficulties  Cognition Arousal/Alertness: Awake/alert Behavior During Therapy: WFL for tasks assessed/performed Overall Cognitive Status: Within Functional Limits for tasks assessed                                        General Comments      Exercises     Assessment/Plan    PT Assessment Patent does not need any further PT services  PT Problem List         PT Treatment Interventions      PT Goals (Current goals can be found in the Care Plan section)  Acute Rehab PT Goals Patient Stated Goal: return to PLOF PT Goal Formulation: All assessment and education complete, DC therapy    Frequency     Barriers to discharge        Co-evaluation               AM-PAC PT "6 Clicks" Mobility  Outcome Measure Help needed turning from your back to your side while in a flat bed without using bedrails?: None Help needed moving from lying on your back to sitting on the side of a flat bed without using bedrails?: None Help needed moving to and from a bed to a chair (including a wheelchair)?: None Help needed standing up from a chair using your arms (e.g., wheelchair or bedside chair)?: None Help needed to walk in hospital room?: None Help needed climbing 3-5 steps  with a railing? : A Little 6 Click Score: 23    End of Session   Activity Tolerance: Patient tolerated treatment well Patient left: Other (comment) (left standing in room) Nurse Communication: Mobility status PT Visit Diagnosis: Difficulty in walking, not elsewhere classified (R26.2)    Time: 6962-9528 PT Time Calculation (min) (ACUTE ONLY): 19 min   Charges:   PT Evaluation $PT Eval Moderate Complexity: 1 Mod          Vale Haven, PT, DPT Acute Rehabilitation Services Pager [phone] Office [phone]     Steve Dickerson 05/01/2021, 9:23 AM

## 2021-05-01 NOTE — Evaluation (Signed)
Occupational Therapy Evaluation Patient Details Name: Steve Dickerson MRN: [id number] DOB: 02-26-1981 Today's Date: 05/01/2021   History of Present Illness Patient is a 40 y/o male who presents on 10/21 with new onset of right-sided numbness/sensory changes and clumsiness. Brain MRI- small subcortical stroke adjacent to the postcentral and precentral gyri on the left. No PMH.   Clinical Impression   Pt admitted for concerns listed above. PTA pt reported that he was independent with all ADL's and IADL's, including working as a Development worker, community. At this time, pt presents with mild weakness/incoordination in his RUE, as well as some numbness in the RUE and RLE. Pt able to complete all ADL's with no difficulty and ambulate safely. Pt has no further OT needs at this time and acute OT will sign off.       Recommendations for follow up therapy are one component of a multi-disciplinary discharge planning process, led by the attending physician.  Recommendations may be updated based on patient status, additional functional criteria and insurance authorization.   Follow Up Recommendations  No OT follow up    Equipment Recommendations  None recommended by OT    Recommendations for Other Services       Precautions / Restrictions Precautions Precautions: None Restrictions Weight Bearing Restrictions: No      Mobility Bed Mobility Overal bed mobility: Independent                  Transfers Overall transfer level: Independent Equipment used: None             General transfer comment: No difficulty standing from EOB without UEs,    Balance Overall balance assessment: Modified Independent;Independent                                         ADL either performed or assessed with clinical judgement   ADL Overall ADL's : Independent                                       General ADL Comments: Pt able to complete all ADL's safely with no assist this  session.     Vision Baseline Vision/History: 0 No visual deficits Ability to See in Adequate Light: 0 Adequate Patient Visual Report: No change from baseline Vision Assessment?: No apparent visual deficits     Perception Perception Perception Tested?: No   Praxis Praxis Praxis tested?: Within functional limits    Pertinent Vitals/Pain Pain Assessment: No/denies pain     Hand Dominance Right   Extremity/Trunk Assessment Upper Extremity Assessment Upper Extremity Assessment: RUE deficits/detail RUE Deficits / Details: Impaired light tough sensation; feels pressure. PT has 4+/5 strength in grip, elbow flexion, and shoulder flexion. Slightly weaker than the LUE RUE Sensation: decreased light touch RUE Coordination: WNL   Lower Extremity Assessment Lower Extremity Assessment: Defer to PT evaluation   Cervical / Trunk Assessment Cervical / Trunk Assessment: Normal   Communication Communication Communication: No difficulties   Cognition Arousal/Alertness: Awake/alert Behavior During Therapy: WFL for tasks assessed/performed Overall Cognitive Status: Within Functional Limits for tasks assessed                                     General Comments  No concerns  Exercises     Shoulder Instructions      Home Living Family/patient expects to be discharged to:: Private residence Living Arrangements: Spouse/significant other;Children Available Help at Discharge: Family;Available PRN/intermittently Type of Home: House Home Access: Stairs to enter [employer] of Steps: 4 Entrance Stairs-Rails: Right;Can reach both;Left Home Layout: Multi-level Alternate Level Stairs-Number of Steps: 2 nd level Alternate Level Stairs-Rails: Right Bathroom Shower/Tub: Producer, television/film/video: Standard     Home Equipment: None          Prior Functioning/Environment Level of Independence: Independent        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        OT Problem List: Decreased strength;Decreased activity tolerance;Impaired balance (sitting and/or standing)      OT Treatment/Interventions:      OT Goals(Current goals can be found in the care plan section) Acute Rehab OT Goals Patient Stated Goal: return to PLOF OT Goal Formulation: All assessment and education complete, DC therapy Time For Goal Achievement: 05/01/21 Potential to Achieve Goals: Good  OT Frequency:     Barriers to D/C:            Co-evaluation              AM-PAC OT "6 Clicks" Daily Activity     Outcome Measure Help from another person eating meals?: None Help from another person taking care of personal grooming?: None Help from another person toileting, which includes using toliet, bedpan, or urinal?: None Help from another person bathing (including washing, rinsing, drying)?: None Help from another person to put on and taking off regular upper body clothing?: None Help from another person to put on and taking off regular lower body clothing?: None 6 Click Score: 24   End of Session Nurse Communication: Mobility status  Activity Tolerance: Patient tolerated treatment well Patient left: in chair;with call bell/phone within reach;with family/visitor present  OT Visit Diagnosis: Unsteadiness on feet (R26.81);Other abnormalities of gait and mobility (R26.89)                Time: 2947-6546 OT Time Calculation (min): 12 min Charges:  OT General Charges $OT Visit: 1 Visit OT Evaluation $OT Eval Low Complexity: 1 Low  Toshie Demelo H., OTR/L Acute Rehabilitation  Ardath Lepak Elane Bing Plume 05/01/2021, 3:49 PM

## 2021-05-01 NOTE — Progress Notes (Signed)
[facility] PROGRESS NOTE    Progress Note  Steve Dickerson  WUJ:[id number] DOB: [birth year] DOA: 04/30/2021 PCP: Pincus Sanes, MD     Brief Narrative:   Steve Dickerson is an 40 y.o. male no significant past medical history comes in with new onset of right-sided numbness and clumsiness my MRI of the brain revealed small acute subcortical stroke, neurology was consulted and recommended full neurological work-up.    Assessment/Plan:   Acute  CVA (cerebral vascular accident) [facility]): HgbA1c 5.5, fasting lipid panel LDL greater than 150 HDL greater than 40 MRI of the brain showed a small acute early infarct, CT of the head no large vessel occlusion. PT, OT, Speech consult pending Transthoracic Echo pending Continue DAPT therapy for 21 days and stop Plavix and continue aspirin. High-dose statins. BP goal: permissive HTN upto 220/120 mmHg Telemetry monitoring Hypercoagulable panel pending.  Elevated blood pressure without a diagnosis of hypertension: Allow for permissive hypertension continue to monitor  Dyslipidemia: Continue statins.  DVT prophylaxis: lovenox Family Communication:none Status is: Observation  The patient remains OBS appropriate and will d/c before 2 midnights.       Code Status:     Code Status Orders  (From admission, onward)           Start     Ordered   04/30/21 1325  Full code  Continuous        04/30/21 1325           Code Status History     This patient has a current code status but no historical code status.         IV Access:   Peripheral IV   Procedures and diagnostic studies:   MR ANGIO HEAD WO CONTRAST  Result Date: 04/30/2021 CLINICAL DATA:  40 year old male presenting with new onset right-sided numbness EXAM: MRI HEAD WITHOUT CONTRAST MRA HEAD WITHOUT CONTRAST TECHNIQUE: Multiplanar, multi-echo pulse sequences of the brain and surrounding structures were acquired without intravenous contrast.  Angiographic images of the Circle of Willis were acquired using MRA technique without intravenous contrast. COMPARISON:  Same-day noncontrast head CT. FINDINGS: MRI HEAD FINDINGS Brain: There is a small focus of patchy diffusion restriction in the left centrum semiovale with associated faint FLAIR signal Vascular: The major flow voids are present. The vasculature is assessed below. Skull and upper cervical spine: Normal marrow signal. Sinuses/Orbits: There is mild mucosal thickening in the paranasal sinuses with a right maxillary sinus mucous retention cyst. The globes and orbits are unremarkable. Other: None. MRA HEAD FINDINGS Anterior circulation: The intracranial ICAs are patent. The bilateral MCAs are patent. The bilateral ACAs are patent. There is no aneurysm. Posterior circulation: The bilateral V4 segments are patent. The basilar artery is patent. The bilateral PCAs are patent. The left posterior communicating artery is not identified. There is a prominent right posterior communicating artery There is no aneurysm. Anatomic variants: As above. IMPRESSION: 1. 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 2. Patent intracranial vasculature. No high-grade stenosis or occlusion. Electronically Signed   By: Lesia Hausen M.D.   On: 04/30/2021 10:16   MR BRAIN WO CONTRAST  Result Date: 04/30/2021 CLINICAL DATA:  40 year old male presenting with new onset right-sided numbness EXAM: MRI HEAD WITHOUT CONTRAST MRA HEAD WITHOUT CONTRAST TECHNIQUE: Multiplanar, multi-echo pulse sequences of the brain and surrounding structures were acquired without intravenous contrast. Angiographic images of the Circle of Willis were acquired using MRA technique without intravenous contrast. COMPARISON:  Same-day noncontrast  head CT. FINDINGS: MRI HEAD FINDINGS Brain: There is a small focus of patchy diffusion restriction in the left centrum semiovale with associated faint FLAIR signal Vascular: The major flow  voids are present. The vasculature is assessed below. Skull and upper cervical spine: Normal marrow signal. Sinuses/Orbits: There is mild mucosal thickening in the paranasal sinuses with a right maxillary sinus mucous retention cyst. The globes and orbits are unremarkable. Other: None. MRA HEAD FINDINGS Anterior circulation: The intracranial ICAs are patent. The bilateral MCAs are patent. The bilateral ACAs are patent. There is no aneurysm. Posterior circulation: The bilateral V4 segments are patent. The basilar artery is patent. The bilateral PCAs are patent. The left posterior communicating artery is not identified. There is a prominent right posterior communicating artery There is no aneurysm. Anatomic variants: As above. IMPRESSION: 1. 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 2. Patent intracranial vasculature. No high-grade stenosis or occlusion. Electronically Signed   By: Lesia Hausen M.D.   On: 04/30/2021 10:16   CT HEAD CODE STROKE WO CONTRAST  Result Date: 04/30/2021 CLINICAL DATA:  Code stroke.  Neuro deficit, acute, stroke suspected EXAM: CT HEAD WITHOUT CONTRAST TECHNIQUE: Contiguous axial images were obtained from the base of the skull through the vertex without intravenous contrast. COMPARISON:  None. FINDINGS: Brain: No evidence of acute large vascular territory infarction, hemorrhage, hydrocephalus, extra-axial collection or mass lesion/mass effect. Vascular: No hyperdense vessel identified. Skull: No acute fracture. Sinuses/Orbits: No acute finding. Other: No mastoid effusions. ASPECTS [facility] Stroke Program Early CT Score) total score (0-10 with 10 being normal): 10. IMPRESSION: No evidence of acute large vascular territory infarct or acute hemorrhage. ASPECTS is 10. Code stroke imaging results were communicated on 04/30/2021 at 9:12 am to provider Dr. Otelia Limes via secure text paging. Electronically Signed   By: Feliberto Harts M.D.   On: 04/30/2021 09:14   VAS  US CAROTID  Result Date: 04/30/2021 Carotid Arterial Duplex Study Patient Name:  Steve Dickerson  Date of Exam:   04/30/2021 Medical Rec #: [id number]        Accession #:    [id number] Date of Birth: 07-11-1981         Patient Gender: M Patient Age:   35 years Exam Location:  [facility] Procedure:      VAS US CAROTID Referring Phys: Mikey College --------------------------------------------------------------------------------  Indications:       CVA and Numbness. Comparison Study:  No prior study Performing Technologist: Sherren Kerns RVS  Examination Guidelines: A complete evaluation includes B-mode imaging, spectral Doppler, color Doppler, and power Doppler as needed of all accessible portions of each vessel. Bilateral testing is considered an integral part of a complete examination. Limited examinations for reoccurring indications may be performed as noted.  Right Carotid Findings: +----------+--------+--------+--------+------------------+------------------+           PSV cm/sEDV cm/sStenosisPlaque DescriptionComments           +----------+--------+--------+--------+------------------+------------------+ CCA Prox  164     22                                intimal thickening +----------+--------+--------+--------+------------------+------------------+ CCA Distal164     33                                intimal thickening +----------+--------+--------+--------+------------------+------------------+ ICA Prox  111     31                                                   +----------+--------+--------+--------+------------------+------------------+  ICA Distal102     37                                                   +----------+--------+--------+--------+------------------+------------------+ ECA       129     16                                                   +----------+--------+--------+--------+------------------+------------------+  +----------+--------+-------+--------+-------------------+           PSV cm/sEDV cmsDescribeArm Pressure (mmHG) +----------+--------+-------+--------+-------------------+ [id number]                                        +----------+--------+-------+--------+-------------------+ +---------+--------+--+--------+--+ VertebralPSV cm/s71EDV cm/s17 +---------+--------+--+--------+--+  Left Carotid Findings: +----------+--------+--------+--------+------------------+------------------+           PSV cm/sEDV cm/sStenosisPlaque DescriptionComments           +----------+--------+--------+--------+------------------+------------------+ CCA Prox  144     21                                intimal thickening +----------+--------+--------+--------+------------------+------------------+ CCA Distal150     21                                intimal thickening +----------+--------+--------+--------+------------------+------------------+ ICA Prox  77      28                                                   +----------+--------+--------+--------+------------------+------------------+ ICA Distal69      22                                                   +----------+--------+--------+--------+------------------+------------------+ ECA       120     10                                                   +----------+--------+--------+--------+------------------+------------------+ +----------+--------+--------+--------+-------------------+           PSV cm/sEDV cm/sDescribeArm Pressure (mmHG) +----------+--------+--------+--------+-------------------+ [id number]                                          +----------+--------+--------+--------+-------------------+ +---------+--------+--+--------+--+ VertebralPSV cm/s55EDV cm/s13 +---------+--------+--+--------+--+   Summary: Right Carotid: There was no evidence of thrombus, dissection, atherosclerotic                 plaque or stenosis in the cervical carotid system. Left Carotid: There is no evidence of stenosis in the left ICA. Vertebrals:  Bilateral vertebral arteries demonstrate antegrade flow. Subclavians: Normal flow hemodynamics were seen in bilateral subclavian              arteries. *See table(s) above for measurements and observations.     Preliminary      Medical Consultants:   None.   Subjective:    Steve Dickerson relates his numbness of the right arm and weakness are improved.  Objective:    Vitals:   04/30/21 1930 04/30/21 2009 05/01/21 0401 05/01/21 0746  BP: 131/82 (!) 137/92 128/88 131/87  Pulse: 66 72 88 71  Resp: 20  16 18   Temp: 97.7 F (36.5 C)  97.7 F (36.5 C) 97.8 F (36.6 C)  TempSrc: Oral   Oral  SpO2: 95% 97% 98% 98%   SpO2: 98 %   Intake/Output Summary (Last 24 hours) at 05/01/2021 0749 Last data filed at 04/30/2021 1142 Gross per 24 hour  Intake --  Output 750 ml  Net -750 ml   There were no vitals filed for this visit.  Exam: General exam: In no acute distress. Respiratory system: Good air movement and clear to auscultation. Cardiovascular system: S1 & S2 heard, RRR. No JVD.  Gastrointestinal system: Abdomen is nondistended, soft and nontender.  Central nervous system: Alert and oriented. No focal neurological deficits. Extremities: No pedal edema. Skin: No rashes, lesions or ulcers Psychiatry: Judgement and insight appear normal. Mood & affect appropriate.    Data Reviewed:    Labs: Basic Metabolic Panel: Recent Labs  Lab 04/30/21 0900 04/30/21 0908  NA 140 140  K 4.6 4.6  CL 103 102  CO2 28  --   GLUCOSE 104* 102*  BUN 14 17  CREATININE 1.06 1.00  CALCIUM 9.6  --    GFR CrCl cannot be calculated (Unknown ideal weight.). Liver Function Tests: Recent Labs  Lab 04/30/21 0900  AST 28  ALT 26  ALKPHOS 78  BILITOT 0.7  PROT 7.4  ALBUMIN 4.3   No results for input(s): LIPASE, AMYLASE in the last 168 hours. No results  for input(s): AMMONIA in the last 168 hours. Coagulation profile Recent Labs  Lab 04/30/21 0900  INR 1.0   COVID-19 Labs  No results for input(s): DDIMER, FERRITIN, LDH, CRP in the last 72 hours.  Lab Results  Component Value Date   SARSCOV2NAA NEGATIVE 04/30/2021    CBC: Recent Labs  Lab 04/30/21 0900 04/30/21 0908  WBC 9.5  --   NEUTROABS 6.7  --   HGB 17.6* 18.4*  HCT 52.9* 54.0*  MCV 90.0  --   PLT 386  --    Cardiac Enzymes: No results for input(s): CKTOTAL, CKMB, CKMBINDEX, TROPONINI in the last 168 hours. BNP (last 3 results) No results for input(s): PROBNP in the last 8760 hours. CBG: Recent Labs  Lab 04/30/21 0857  GLUCAP 111*   D-Dimer: No results for input(s): DDIMER in the last 72 hours. Hgb A1c: Recent Labs    05/01/21 0155  HGBA1C 5.5   Lipid Profile: Recent Labs    05/01/21 0155  CHOL 235*  HDL 59  LDLCALC 158*  TRIG 90  CHOLHDL 4.0   Thyroid function studies: Recent Labs    05/01/21 0155  TSH 4.203   Anemia work up: Recent Labs    05/01/21 0155  VITAMINB12 380   Sepsis Labs: Recent Labs  Lab 04/30/21 0900  WBC 9.5   Microbiology Recent Results (from the past 240 hour(s))  Resp Panel by RT-PCR (Flu A&B, Covid)  Nasopharyngeal Swab     Status: None   Collection Time: 04/30/21  8:55 AM   Specimen: Nasopharyngeal Swab; Nasopharyngeal(NP) swabs in vial transport medium  Result Value Ref Range Status   SARS Coronavirus 2 by RT PCR NEGATIVE NEGATIVE Final    Comment: (NOTE) SARS-CoV-2 target nucleic acids are NOT DETECTED.  The SARS-CoV-2 RNA is generally detectable in upper respiratory specimens during the acute phase of infection. The lowest concentration of SARS-CoV-2 viral copies this assay can detect is 138 copies/mL. A negative result does not preclude SARS-Cov-2 infection and should not be used as the sole basis for treatment or other patient management decisions. A negative result may occur with  improper  specimen collection/handling, submission of specimen other than nasopharyngeal swab, presence of viral mutation(s) within the areas targeted by this assay, and inadequate number of viral copies(<138 copies/mL). A negative result must be combined with clinical observations, patient history, and epidemiological information. The expected result is Negative.  Fact Sheet for Patients:  BloggerCourse.com  Fact Sheet for Healthcare Providers:  SeriousBroker.it  This test is no t yet approved or cleared by the [place] FDA and  has been authorized for detection and/or diagnosis of SARS-CoV-2 by FDA under an Emergency Use Authorization (EUA). This EUA will remain  in effect (meaning this test can be used) for the duration of the COVID-19 declaration under Section 564(b)(1) of the Act, 21 U.S.C.section 360bbb-3(b)(1), unless the authorization is terminated  or revoked sooner.       Influenza A by PCR NEGATIVE NEGATIVE Final   Influenza B by PCR NEGATIVE NEGATIVE Final    Comment: (NOTE) The Xpert Xpress SARS-CoV-2/FLU/RSV plus assay is intended as an aid in the diagnosis of influenza from Nasopharyngeal swab specimens and should not be used as a sole basis for treatment. Nasal washings and aspirates are unacceptable for Xpert Xpress SARS-CoV-2/FLU/RSV testing.  Fact Sheet for Patients: BloggerCourse.com  Fact Sheet for Healthcare Providers: SeriousBroker.it  This test is not yet approved or cleared by the [place] FDA and has been authorized for detection and/or diagnosis of SARS-CoV-2 by FDA under an Emergency Use Authorization (EUA). This EUA will remain in effect (meaning this test can be used) for the duration of the COVID-19 declaration under Section 564(b)(1) of the Act, 21 U.S.C. section 360bbb-3(b)(1), unless the authorization is terminated or revoked.  Performed at  [facility], [address], [place], [place] [address]      Medications:     stroke: mapping our early stages of recovery book   Does not apply Once   aspirin EC  81 mg Oral Daily   atorvastatin  40 mg Oral Daily   clopidogrel  75 mg Oral Daily   enoxaparin (LOVENOX) injection  40 mg Subcutaneous Q24H   venlafaxine XR  37.5 mg Oral Q breakfast   Continuous Infusions:    LOS: 0 days   Marinda Elk  [facility]  05/01/2021, 7:49 AM

## 2021-05-01 NOTE — Progress Notes (Signed)
TCD bubble study and lower extremity venous study completed.   Please see CV Proc for preliminary results.   Jean Rosenthal, RDMS, RVT

## 2021-05-01 NOTE — Progress Notes (Signed)
STROKE TEAM PROGRESS NOTE   SUBJECTIVE (INTERVAL HISTORY) Patient was seen at vascular lab well performing TCD bubble study.  His wife at bedside.  Patient currently neurologically intact, stated that he has right hand/arm numbness has resolved.  TCD bubble study showed moderate PFO.  Will refer to Dr. Excell Seltzer for PFO closure.  OBJECTIVE Temp:  [97.7 F (36.5 C)-97.8 F (36.6 C)] 97.8 F (36.6 C) (10/21 0746) Pulse Rate:  [66-90] 90 (10/21 1456) Cardiac Rhythm: Normal sinus rhythm (10/21 0835) Resp:  [16-20] 18 (10/21 1456) BP: (128-137)/(82-92) 129/86 (10/21 1456) SpO2:  [95 %-99 %] 99 % (10/21 1456)  Recent Labs  Lab 04/30/21 0857  GLUCAP 111*   Recent Labs  Lab 04/30/21 0900 04/30/21 0908  NA 140 140  K 4.6 4.6  CL 103 102  CO2 28  --   GLUCOSE 104* 102*  BUN 14 17  CREATININE 1.06 1.00  CALCIUM 9.6  --    Recent Labs  Lab 04/30/21 0900  AST 28  ALT 26  ALKPHOS 78  BILITOT 0.7  PROT 7.4  ALBUMIN 4.3   Recent Labs  Lab 04/30/21 0900 04/30/21 0908  WBC 9.5  --   NEUTROABS 6.7  --   HGB 17.6* 18.4*  HCT 52.9* 54.0*  MCV 90.0  --   PLT 386  --    No results for input(s): CKTOTAL, CKMB, CKMBINDEX, TROPONINI in the last 168 hours. Recent Labs    04/30/21 0900  LABPROT 12.9  INR 1.0   Recent Labs    04/30/21 0855  COLORURINE YELLOW  LABSPEC 1.008  PHURINE 7.0  GLUCOSEU NEGATIVE  HGBUR SMALL*  BILIRUBINUR NEGATIVE  KETONESUR NEGATIVE  PROTEINUR NEGATIVE  NITRITE NEGATIVE  LEUKOCYTESUR NEGATIVE       Component Value Date/Time   CHOL 235 (H) 05/01/2021 0155   TRIG 90 05/01/2021 0155   HDL 59 05/01/2021 0155   CHOLHDL 4.0 05/01/2021 0155   VLDL 18 05/01/2021 0155   LDLCALC 158 (H) 05/01/2021 0155   LDLCALC 112 (H) 04/12/2019 0646   Lab Results  Component Value Date   HGBA1C 5.5 05/01/2021      Component Value Date/Time   LABOPIA NONE DETECTED 05/01/2021 0857   COCAINSCRNUR NONE DETECTED 05/01/2021 0857   LABBENZ NONE DETECTED  05/01/2021 0857   AMPHETMU NONE DETECTED 05/01/2021 0857   THCU NONE DETECTED 05/01/2021 0857   LABBARB NONE DETECTED 05/01/2021 0857    Recent Labs  Lab 04/30/21 0900  ETH <10    I have personally reviewed the radiological images below and agree with the radiology interpretations.  MR ANGIO HEAD WO CONTRAST  Result Date: 04/30/2021 CLINICAL DATA:  40 year old male presenting with new onset right-sided numbness EXAM: MRI HEAD WITHOUT CONTRAST MRA HEAD WITHOUT CONTRAST TECHNIQUE: Multiplanar, multi-echo pulse sequences of the brain and surrounding structures were acquired without intravenous contrast. Angiographic images of the Circle of Willis were acquired using MRA technique without intravenous contrast. COMPARISON:  Same-day noncontrast head CT. FINDINGS: MRI HEAD FINDINGS Brain: There is a small focus of patchy diffusion restriction in the left centrum semiovale with associated faint FLAIR signal Vascular: The major flow voids are present. The vasculature is assessed below. Skull and upper cervical spine: Normal marrow signal. Sinuses/Orbits: There is mild mucosal thickening in the paranasal sinuses with a right maxillary sinus mucous retention cyst. The globes and orbits are unremarkable. Other: None. MRA HEAD FINDINGS Anterior circulation: The intracranial ICAs are patent. The bilateral MCAs are patent. The bilateral ACAs are  patent. There is no aneurysm. Posterior circulation: The bilateral V4 segments are patent. The basilar artery is patent. The bilateral PCAs are patent. The left posterior communicating artery is not identified. There is a prominent right posterior communicating artery There is no aneurysm. Anatomic variants: As above. IMPRESSION: 1. 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 2. Patent intracranial vasculature. No high-grade stenosis or occlusion. Electronically Signed   By: Lesia Hausen M.D.   On: 04/30/2021 10:16   MR BRAIN WO  CONTRAST  Result Date: 04/30/2021 CLINICAL DATA:  40 year old male presenting with new onset right-sided numbness EXAM: MRI HEAD WITHOUT CONTRAST MRA HEAD WITHOUT CONTRAST TECHNIQUE: Multiplanar, multi-echo pulse sequences of the brain and surrounding structures were acquired without intravenous contrast. Angiographic images of the Circle of Willis were acquired using MRA technique without intravenous contrast. COMPARISON:  Same-day noncontrast head CT. FINDINGS: MRI HEAD FINDINGS Brain: There is a small focus of patchy diffusion restriction in the left centrum semiovale with associated faint FLAIR signal Vascular: The major flow voids are present. The vasculature is assessed below. Skull and upper cervical spine: Normal marrow signal. Sinuses/Orbits: There is mild mucosal thickening in the paranasal sinuses with a right maxillary sinus mucous retention cyst. The globes and orbits are unremarkable. Other: None. MRA HEAD FINDINGS Anterior circulation: The intracranial ICAs are patent. The bilateral MCAs are patent. The bilateral ACAs are patent. There is no aneurysm. Posterior circulation: The bilateral V4 segments are patent. The basilar artery is patent. The bilateral PCAs are patent. The left posterior communicating artery is not identified. There is a prominent right posterior communicating artery There is no aneurysm. Anatomic variants: As above. IMPRESSION: 1. 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 2. Patent intracranial vasculature. No high-grade stenosis or occlusion. Electronically Signed   By: Lesia Hausen M.D.   On: 04/30/2021 10:16   VAS [place] TRANSCRANIAL DOPPLER W BUBBLES  Result Date: 05/01/2021  Transcranial Doppler with Bubble Patient Name:  Steve Dickerson  Date of Exam:   05/01/2021 Medical Rec #: [id number]        Accession #:    [id number] Date of Birth: 08/04/1980         Patient Gender: M Patient Age:   40 years Exam Location:  [facility] Procedure:       VAS [place] TRANSCRANIAL DOPPLER W BUBBLES Referring Phys: Scheryl Marten Iokepa Geffre --------------------------------------------------------------------------------  Indications: Stroke. Comparison Study: No prior studies. Performing Technologist: Jean Rosenthal RDMS, RVT  Examination Guidelines: A complete evaluation includes B-mode imaging, spectral Doppler, color Doppler, and power Doppler as needed of all accessible portions of each vessel. Bilateral testing is considered an integral part of a complete examination. Limited examinations for reoccurring indications may be performed as noted.  Summary:  A vascular evaluation was performed. The right middle cerebral artery was studied. An IV was inserted into the patient's right forearm. Verbal informed consent was obtained.  Between 10 and 15 high intensity transient signals (HITS) were observed at rest, indicating a Spencer Grade 2 patent foramen ovale (PFO). A partial curtain of high intensity transient signals (HITS) were observed with valsalva, indicating a Spencer Grade  4 patent foramen ovale (PFO) with valsalva maneuver. *See table(s) above for TCD measurements and observations.    Preliminary    ECHOCARDIOGRAM COMPLETE  Result Date: 05/01/2021    ECHOCARDIOGRAM REPORT   Patient Name:   Steve Dickerson Date of Exam: 05/01/2021 Medical Rec #:  [id number]  Height:       71.0 in Accession #:    [id number]      Weight:       170.0 lb Date of Birth:  11/12/1980        BSA:          1.968 m Patient Age:    40 years        BP:           131/87 mmHg Patient Gender: M               HR:           78 bpm. Exam Location:  Inpatient Procedure: 2D Echo Indications:    stroke  History:        Patient has prior history of Echocardiogram examinations, most                 recent 04/25/2020.  Sonographer:    Delcie Roch RDCS Referring Phys: [id number] Emeline General IMPRESSIONS  1. Left ventricular ejection fraction, by estimation, is 60 to 65%. The left ventricle has normal function.  The left ventricle has no regional wall motion abnormalities. Left ventricular diastolic parameters were normal.  2. Right ventricular systolic function is normal. The right ventricular size is mildly enlarged.  3. Right atrial size was mildly dilated.  4. The mitral valve is normal in structure. Trivial mitral valve regurgitation. No evidence of mitral stenosis.  5. The aortic valve is tricuspid. Aortic valve regurgitation is not visualized. No aortic stenosis is present.  6. The inferior vena cava is normal in size with greater than 50% respiratory variability, suggesting right atrial pressure of 3 mmHg. FINDINGS  Left Ventricle: Left ventricular ejection fraction, by estimation, is 60 to 65%. The left ventricle has normal function. The left ventricle has no regional wall motion abnormalities. The left ventricular internal cavity size was normal in size. There is  no left ventricular hypertrophy. Left ventricular diastolic parameters were normal. Right Ventricle: The right ventricular size is mildly enlarged. Right ventricular systolic function is normal. Left Atrium: Left atrial size was normal in size. Right Atrium: Right atrial size was mildly dilated. Pericardium: There is no evidence of pericardial effusion. Mitral Valve: The mitral valve is normal in structure. Trivial mitral valve regurgitation. No evidence of mitral valve stenosis. Tricuspid Valve: The tricuspid valve is normal in structure. Tricuspid valve regurgitation is trivial. No evidence of tricuspid stenosis. Aortic Valve: The aortic valve is tricuspid. Aortic valve regurgitation is not visualized. No aortic stenosis is present. Aortic valve mean gradient measures 4.0 mmHg. Aortic valve peak gradient measures 7.3 mmHg. Aortic valve area, by VTI measures 2.38 cm. Pulmonic Valve: The pulmonic valve was normal in structure. Pulmonic valve regurgitation is not visualized. No evidence of pulmonic stenosis. Aorta: The aortic root is normal in size and  structure. Venous: The inferior vena cava is normal in size with greater than 50% respiratory variability, suggesting right atrial pressure of 3 mmHg. IAS/Shunts: No atrial level shunt detected by color flow Doppler.  LEFT VENTRICLE PLAX 2D LVIDd:         5.10 cm   Diastology LVIDs:         2.90 cm   LV e' medial:    7.94 cm/s LV PW:         0.90 cm   LV E/e' medial:  7.3 LV IVS:        0.90 cm   LV e' lateral:   12.50 cm/s  LVOT diam:     1.90 cm   LV E/e' lateral: 4.6 LV SV:         62 LV SV Index:   32 LVOT Area:     2.84 cm  RIGHT VENTRICLE             IVC RV Basal diam:  4.80 cm     IVC diam: 1.70 cm RV Mid diam:    4.00 cm RV S prime:     21.40 cm/s TAPSE (M-mode): 2.9 cm LEFT ATRIUM             Index        RIGHT ATRIUM           Index LA diam:        3.90 cm 1.98 cm/m   RA Area:     22.30 cm LA Vol (A2C):   36.1 ml 18.35 ml/m  RA Volume:   71.60 ml  36.39 ml/m LA Vol (A4C):   25.9 ml 13.16 ml/m LA Biplane Vol: 30.7 ml 15.60 ml/m  AORTIC VALVE AV Area (Vmax):    2.51 cm AV Area (Vmean):   2.38 cm AV Area (VTI):     2.38 cm AV Vmax:           135.00 cm/s AV Vmean:          88.900 cm/s AV VTI:            0.261 m AV Peak Grad:      7.3 mmHg AV Mean Grad:      4.0 mmHg LVOT Vmax:         119.50 cm/s LVOT Vmean:        74.700 cm/s LVOT VTI:          0.219 m LVOT/AV VTI ratio: 0.84  AORTA Ao Root diam: 3.10 cm Ao Asc diam:  3.00 cm MITRAL VALVE MV Area (PHT): 4.39 cm    SHUNTS MV Decel Time: 173 msec    Systemic VTI:  0.22 m MV E velocity: 57.80 cm/s  Systemic Diam: 1.90 cm MV A velocity: 60.40 cm/s MV E/A ratio:  0.96 Olga Millers MD Electronically signed by Olga Millers MD Signature Date/Time: 05/01/2021/12:34:59 PM    Final    CT HEAD CODE STROKE WO CONTRAST  Result Date: 04/30/2021 CLINICAL DATA:  Code stroke.  Neuro deficit, acute, stroke suspected EXAM: CT HEAD WITHOUT CONTRAST TECHNIQUE: Contiguous axial images were obtained from the base of the skull through the vertex without intravenous  contrast. COMPARISON:  None. FINDINGS: Brain: No evidence of acute large vascular territory infarction, hemorrhage, hydrocephalus, extra-axial collection or mass lesion/mass effect. Vascular: No hyperdense vessel identified. Skull: No acute fracture. Sinuses/Orbits: No acute finding. Other: No mastoid effusions. ASPECTS [facility] Stroke Program Early CT Score) total score (0-10 with 10 being normal): 10. IMPRESSION: No evidence of acute large vascular territory infarct or acute hemorrhage. ASPECTS is 10. Code stroke imaging results were communicated on 04/30/2021 at 9:12 am to provider Dr. Otelia Limes via secure text paging. Electronically Signed   By: Feliberto Harts M.D.   On: 04/30/2021 09:14   VAS US CAROTID  Result Date: 04/30/2021 Carotid Arterial Duplex Study Patient Name:  Steve Dickerson  Date of Exam:   04/30/2021 Medical Rec #: [id number]        Accession #:    [id number] Date of Birth: 1980/10/22         Patient Gender: M Patient Age:   49 years Exam  Location:  [facility] Procedure:      VAS US CAROTID Referring Phys: Mikey College --------------------------------------------------------------------------------  Indications:       CVA and Numbness. Comparison Study:  No prior study Performing Technologist: Sherren Kerns RVS  Examination Guidelines: A complete evaluation includes B-mode imaging, spectral Doppler, color Doppler, and power Doppler as needed of all accessible portions of each vessel. Bilateral testing is considered an integral part of a complete examination. Limited examinations for reoccurring indications may be performed as noted.  Right Carotid Findings: +----------+--------+--------+--------+------------------+------------------+           PSV cm/sEDV cm/sStenosisPlaque DescriptionComments           +----------+--------+--------+--------+------------------+------------------+ CCA Prox  164     22                                intimal thickening  +----------+--------+--------+--------+------------------+------------------+ CCA Distal164     33                                intimal thickening +----------+--------+--------+--------+------------------+------------------+ ICA Prox  111     31                                                   +----------+--------+--------+--------+------------------+------------------+ ICA Distal102     37                                                   +----------+--------+--------+--------+------------------+------------------+ ECA       129     16                                                   +----------+--------+--------+--------+------------------+------------------+ +----------+--------+-------+--------+-------------------+           PSV cm/sEDV cmsDescribeArm Pressure (mmHG) +----------+--------+-------+--------+-------------------+ [id number]                                        +----------+--------+-------+--------+-------------------+ +---------+--------+--+--------+--+ VertebralPSV cm/s71EDV cm/s17 +---------+--------+--+--------+--+  Left Carotid Findings: +----------+--------+--------+--------+------------------+------------------+           PSV cm/sEDV cm/sStenosisPlaque DescriptionComments           +----------+--------+--------+--------+------------------+------------------+ CCA Prox  144     21                                intimal thickening +----------+--------+--------+--------+------------------+------------------+ CCA Distal150     21                                intimal thickening +----------+--------+--------+--------+------------------+------------------+ ICA Prox  77      28                                                   +----------+--------+--------+--------+------------------+------------------+  ICA Distal69      22                                                    +----------+--------+--------+--------+------------------+------------------+ ECA       120     10                                                   +----------+--------+--------+--------+------------------+------------------+ +----------+--------+--------+--------+-------------------+           PSV cm/sEDV cm/sDescribeArm Pressure (mmHG) +----------+--------+--------+--------+-------------------+ [id number]                                          +----------+--------+--------+--------+-------------------+ +---------+--------+--+--------+--+ VertebralPSV cm/s55EDV cm/s13 +---------+--------+--+--------+--+   Summary: Right Carotid: There was no evidence of thrombus, dissection, atherosclerotic                plaque or stenosis in the cervical carotid system. Left Carotid: There is no evidence of stenosis in the left ICA. Vertebrals:  Bilateral vertebral arteries demonstrate antegrade flow. Subclavians: Normal flow hemodynamics were seen in bilateral subclavian              arteries. *See table(s) above for measurements and observations.     Preliminary    VAS [place] LOWER EXTREMITY VENOUS (DVT)  Result Date: 05/01/2021  Lower Venous DVT Study Patient Name:  Steve Dickerson  Date of Exam:   05/01/2021 Medical Rec #: [id number]        Accession #:    [id number] Date of Birth: 1981-03-16         Patient Gender: M Patient Age:   86 years Exam Location:  [facility] Procedure:      VAS [place] LOWER EXTREMITY VENOUS (DVT) Referring Phys: Scheryl Marten Grant Swager --------------------------------------------------------------------------------  Indications: Patent foramen ovale, stroke.  Comparison Study: No prior studies. Performing Technologist: Jean Rosenthal RDMS, RVT  Examination Guidelines: A complete evaluation includes B-mode imaging, spectral Doppler, color Doppler, and power Doppler as needed of all accessible portions of each vessel. Bilateral testing is considered an integral part of a complete  examination. Limited examinations for reoccurring indications may be performed as noted. The reflux portion of the exam is performed with the patient in reverse Trendelenburg.  +---------+---------------+---------+-----------+----------+--------------+ RIGHT    CompressibilityPhasicitySpontaneityPropertiesThrombus Aging +---------+---------------+---------+-----------+----------+--------------+ CFV      Full           Yes      Yes                                 +---------+---------------+---------+-----------+----------+--------------+ SFJ      Full                                                        +---------+---------------+---------+-----------+----------+--------------+ FV Prox  Full                                                        +---------+---------------+---------+-----------+----------+--------------+  FV Mid   Full                                                        +---------+---------------+---------+-----------+----------+--------------+ FV DistalFull                                                        +---------+---------------+---------+-----------+----------+--------------+ PFV      Full                                                        +---------+---------------+---------+-----------+----------+--------------+ POP      Full           Yes      Yes                                 +---------+---------------+---------+-----------+----------+--------------+ PTV      Full                                                        +---------+---------------+---------+-----------+----------+--------------+ PERO     Full                                                        +---------+---------------+---------+-----------+----------+--------------+ Gastroc  Full                                                        +---------+---------------+---------+-----------+----------+--------------+    +---------+---------------+---------+-----------+----------+--------------+ LEFT     CompressibilityPhasicitySpontaneityPropertiesThrombus Aging +---------+---------------+---------+-----------+----------+--------------+ CFV      Full           Yes      Yes                                 +---------+---------------+---------+-----------+----------+--------------+ SFJ      Full                                                        +---------+---------------+---------+-----------+----------+--------------+ FV Prox  Full                                                        +---------+---------------+---------+-----------+----------+--------------+  FV Mid   Full                                                        +---------+---------------+---------+-----------+----------+--------------+ FV DistalFull                                                        +---------+---------------+---------+-----------+----------+--------------+ PFV      Full                                                        +---------+---------------+---------+-----------+----------+--------------+ POP      Full           Yes      Yes                                 +---------+---------------+---------+-----------+----------+--------------+ PTV      Full                                                        +---------+---------------+---------+-----------+----------+--------------+ PERO     Full                                                        +---------+---------------+---------+-----------+----------+--------------+ Gastroc  Partial                                                     +---------+---------------+---------+-----------+----------+--------------+     Summary: RIGHT: - There is no evidence of deep vein thrombosis in the lower extremity.  - No cystic structure found in the popliteal fossa.  LEFT: - There is no evidence of deep vein thrombosis in  the lower extremity.  - No cystic structure found in the popliteal fossa.  *See table(s) above for measurements and observations. Electronically signed by Waverly Ferrari MD on 05/01/2021 at 3:08:18 PM.    Final       PHYSICAL EXAM  Temp:  [97.7 F (36.5 C)-97.8 F (36.6 C)] 97.8 F (36.6 C) (10/21 0746) Pulse Rate:  [66-90] 90 (10/21 1456) Resp:  [16-20] 18 (10/21 1456) BP: (128-137)/(82-92) 129/86 (10/21 1456) SpO2:  [95 %-99 %] 99 % (10/21 1456)  General - Well nourished, well developed, in no apparent distress.  Ophthalmologic - fundi not visualized due to noncooperation.  Cardiovascular - Regular rhythm and rate.  Mental Status -  Level of arousal and orientation to time, place, and person were intact. Language including expression, naming, repetition, comprehension was  assessed and found intact. Attention span and concentration were normal. Recent and remote memory were intact. Fund of Knowledge was assessed and was intact.  Cranial Nerves II - XII - II - Visual field intact OU. III, IV, VI - Extraocular movements intact. V - Facial sensation intact bilaterally. VII - Facial movement intact bilaterally. VIII - Hearing & vestibular intact bilaterally. X - Palate elevates symmetrically. XI - Chin turning & shoulder shrug intact bilaterally. XII - Tongue protrusion intact.  Motor Strength - The patient's strength was normal in all extremities and pronator drift was absent.  Bulk was normal and fasciculations were absent.   Motor Tone - Muscle tone was assessed at the neck and appendages and was normal.  Reflexes - The patient's reflexes were symmetrical in all extremities and he had no pathological reflexes.  Sensory - Light touch, temperature/pinprick were assessed and were symmetrical.    Coordination - The patient had normal movements in the hands and feet with no ataxia or dysmetria.  Tremor was absent.  Gait and Station - deferred.   ASSESSMENT/PLAN Mr.  Steve Dickerson is a 40 y.o. male with no significant medical history admitted for right arm face leg numbness associate with frontal headache. No tPA given due to outside window.    Stroke:  left small SO infarct likely secondary to moderate sized PFO CT no acute abnormality. MRI left small semiovale acute infarct MRA unremarkable Carotid Doppler unremarkable 2D Echo EF 60 to 65% LE venous Doppler no DVT TCD bubble study Spencer degree 2 at rest, 4 with Valsalva. LDL 158 HgbA1c 5.5 SCDs for VTE prophylaxis No antithrombotic prior to admission, now on aspirin 81 mg daily and clopidogrel 75 mg daily.  Continue DAPT and he was sees Dr. Excell Seltzer cardiology. Patient counseled to be compliant with his antithrombotic medications Ongoing aggressive stroke risk factor management Therapy recommendations: None Disposition: Home  PFO Likely the source of stroke TCD bubble study Spencer degree 2 at rest, 4 with Valsalva. Rope score = 8  Will refer to Dr. Excell Seltzer to consider PFO closure  Hyperlipidemia Home meds: None LDL 158, goal < 71 Now on Lipitor 40 Continue statin at discharge  Other Stroke Risk Factors   Other Active Problems Polycythemia hemoglobin 17.6-18.4  Hospital day # 0  Neurology will sign off. Please call with questions. Pt will follow up with stroke clinic NP at [facility] in about 4 weeks. Thanks for the consult.   Marvel Plan, MD PhD Stroke Neurology 05/01/2021 6:53 PM Pain     To contact Stroke Continuity provider, please refer to [email or web address]. After hours, contact General Neurology

## 2021-05-01 NOTE — Discharge Summary (Signed)
Physician Discharge Summary  Steve Dickerson WSF:[id number] DOB: 1981/05/28 DOA: 04/30/2021  PCP: Pincus Sanes, MD  Admit date: 04/30/2021 Discharge date: 05/01/2021  Admitted From: Home  Disposition:  Home   Recommendations for Outpatient Follow-up:  Follow up with Cardiology Dr. Excell Seltzer in 1-2 weeks Please obtain BMP/CBC in one week Will need TEE as an outpatient  Home Health:No  Equipment/Devices:None   Discharge Condition:Stable  CODE STATUS:Full  Diet recommendation: Heart Healthy   Brief/Interim Summary: 40 y.o. male no significant past medical history comes in with new onset of right-sided numbness and clumsiness my MRI of the brain revealed small acute subcortical stroke, neurology was consulted and recommended full neurological work-up.    Discharge Diagnoses:  Active Problems:   CVA (cerebral vascular accident) (HCC)  Acute CVA: A1c of 5 LDL greater than 150 HDL greater than 40 he was started on high-dose statins. Neurology was consulted and MRI showed small acute early infarct, CT a of the head showed no large vessel occlusion. Physical therapy evaluated the patient and recommended no home health PT transthoracic bubble echo was done that show a PFO. Neurology recommended to follow-up with cardiology and an outpatient to perform a TEE and probable closure as an outpatient. He will go home on DAPT therapy aspirin and Plavix and high-dose statin. His blood pressure was elevated but was trending down during his hospital stay he has no history of hypertension. Neurology will also follow-up on the hypercoagulable panel as an outpatient.  Elevated blood pressure without a diagnosis of hypertension: Permissive hypertension was allowed during his hospital stay his blood pressure was slowly trending down he will follow-up with cardiology and neurology as an outpatient and titrate antihypertensive medications as needed.  Dyslipidemia: He was started on a statin LFTs  were checked will need to be checked in 6 weeks.    Discharge Instructions  Discharge Instructions     Diet - low sodium heart healthy   Complete by: As directed    Increase activity slowly   Complete by: As directed       Allergies as of 05/01/2021   No Known Allergies      Medication List     STOP taking these medications    fluconazole 150 MG tablet Commonly known as: DIFLUCAN       TAKE these medications    Aspirin Low Dose 81 MG EC tablet Generic drug: aspirin Take 1 tablet (81 mg total) by mouth daily. Swallow whole.   busPIRone 5 MG tablet Commonly known as: BUSPAR Take 1 tablet (5 mg total) by mouth 3 (three) times daily.   clopidogrel 75 MG tablet Commonly known as: PLAVIX Take 1 tablet (75 mg total) by mouth daily.   multivitamin Tabs tablet Take 1 tablet by mouth 3 (three) times a week.   rosuvastatin 20 MG tablet Commonly known as: Crestor Take 1 tablet (20 mg total) by mouth daily.   venlafaxine XR 37.5 MG 24 hr capsule Commonly known as: Effexor XR Take 1 capsule (37.5 mg total) by mouth daily with breakfast.   venlafaxine XR 37.5 MG 24 hr capsule Commonly known as: EFFEXOR-XR TAKE 1 CAPSULE BY MOUTH ONCE A DAY WITH BREAKFAST   Vitamin D3 Super Strength 50 MCG (2000 UT) Caps Generic drug: Cholecalciferol Take 2,000 Units by mouth 3 (three) times a week.        No Known Allergies  Consultations: Neurology   Procedures/Studies: MR ANGIO HEAD WO CONTRAST  Result Date: 04/30/2021 CLINICAL DATA:  40 year old male presenting with new onset right-sided numbness EXAM: MRI HEAD WITHOUT CONTRAST MRA HEAD WITHOUT CONTRAST TECHNIQUE: Multiplanar, multi-echo pulse sequences of the brain and surrounding structures were acquired without intravenous contrast. Angiographic images of the Circle of Willis were acquired using MRA technique without intravenous contrast. COMPARISON:  Same-day noncontrast head CT. FINDINGS: MRI HEAD FINDINGS Brain:  There is a small focus of patchy diffusion restriction in the left centrum semiovale with associated faint FLAIR signal Vascular: The major flow voids are present. The vasculature is assessed below. Skull and upper cervical spine: Normal marrow signal. Sinuses/Orbits: There is mild mucosal thickening in the paranasal sinuses with a right maxillary sinus mucous retention cyst. The globes and orbits are unremarkable. Other: None. MRA HEAD FINDINGS Anterior circulation: The intracranial ICAs are patent. The bilateral MCAs are patent. The bilateral ACAs are patent. There is no aneurysm. Posterior circulation: The bilateral V4 segments are patent. The basilar artery is patent. The bilateral PCAs are patent. The left posterior communicating artery is not identified. There is a prominent right posterior communicating artery There is no aneurysm. Anatomic variants: As above. IMPRESSION: 1. 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 2. Patent intracranial vasculature. No high-grade stenosis or occlusion. Electronically Signed   By: Lesia Hausen M.D.   On: 04/30/2021 10:16   MR BRAIN WO CONTRAST  Result Date: 04/30/2021 CLINICAL DATA:  40 year old male presenting with new onset right-sided numbness EXAM: MRI HEAD WITHOUT CONTRAST MRA HEAD WITHOUT CONTRAST TECHNIQUE: Multiplanar, multi-echo pulse sequences of the brain and surrounding structures were acquired without intravenous contrast. Angiographic images of the Circle of Willis were acquired using MRA technique without intravenous contrast. COMPARISON:  Same-day noncontrast head CT. FINDINGS: MRI HEAD FINDINGS Brain: There is a small focus of patchy diffusion restriction in the left centrum semiovale with associated faint FLAIR signal Vascular: The major flow voids are present. The vasculature is assessed below. Skull and upper cervical spine: Normal marrow signal. Sinuses/Orbits: There is mild mucosal thickening in the paranasal sinuses with  a right maxillary sinus mucous retention cyst. The globes and orbits are unremarkable. Other: None. MRA HEAD FINDINGS Anterior circulation: The intracranial ICAs are patent. The bilateral MCAs are patent. The bilateral ACAs are patent. There is no aneurysm. Posterior circulation: The bilateral V4 segments are patent. The basilar artery is patent. The bilateral PCAs are patent. The left posterior communicating artery is not identified. There is a prominent right posterior communicating artery There is no aneurysm. Anatomic variants: As above. IMPRESSION: 1. 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 2. Patent intracranial vasculature. No high-grade stenosis or occlusion. Electronically Signed   By: Lesia Hausen M.D.   On: 04/30/2021 10:16   VAS [place] TRANSCRANIAL DOPPLER W BUBBLES  Result Date: 05/01/2021  Transcranial Doppler with Bubble Patient Name:  Steve Dickerson  Date of Exam:   05/01/2021 Medical Rec #: [id number]        Accession #:    [id number] Date of Birth: 19-Jun-1981         Patient Gender: M Patient Age:   16 years Exam Location:  [facility] Procedure:      VAS [place] TRANSCRANIAL DOPPLER W BUBBLES Referring Phys: Scheryl Marten XU --------------------------------------------------------------------------------  Indications: Stroke. Comparison Study: No prior studies. Performing Technologist: Jean Rosenthal RDMS, RVT  Examination Guidelines: A complete evaluation includes B-mode imaging, spectral Doppler, color Doppler, and power Doppler as needed of all accessible portions of each vessel. Bilateral testing is  considered an integral part of a complete examination. Limited examinations for reoccurring indications may be performed as noted.  Summary:  A vascular evaluation was performed. The right middle cerebral artery was studied. An IV was inserted into the patient's right forearm. Verbal informed consent was obtained.  Between 10 and 15 high intensity transient signals (HITS)  were observed at rest, indicating a Spencer Grade 2 patent foramen ovale (PFO). A partial curtain of high intensity transient signals (HITS) were observed with valsalva, indicating a Spencer Grade  4 patent foramen ovale (PFO) with valsalva maneuver. *See table(s) above for TCD measurements and observations.    Preliminary    ECHOCARDIOGRAM COMPLETE  Result Date: 05/01/2021    ECHOCARDIOGRAM REPORT   Patient Name:   Steve Dickerson Date of Exam: 05/01/2021 Medical Rec #:  [id number]       Height:       71.0 in Accession #:    [id number]      Weight:       170.0 lb Date of Birth:  04/09/1981        BSA:          1.968 m Patient Age:    40 years        BP:           131/87 mmHg Patient Gender: M               HR:           78 bpm. Exam Location:  Inpatient Procedure: 2D Echo Indications:    stroke  History:        Patient has prior history of Echocardiogram examinations, most                 recent 04/25/2020.  Sonographer:    Delcie Roch RDCS Referring Phys: [id number] Emeline General IMPRESSIONS  1. Left ventricular ejection fraction, by estimation, is 60 to 65%. The left ventricle has normal function. The left ventricle has no regional wall motion abnormalities. Left ventricular diastolic parameters were normal.  2. Right ventricular systolic function is normal. The right ventricular size is mildly enlarged.  3. Right atrial size was mildly dilated.  4. The mitral valve is normal in structure. Trivial mitral valve regurgitation. No evidence of mitral stenosis.  5. The aortic valve is tricuspid. Aortic valve regurgitation is not visualized. No aortic stenosis is present.  6. The inferior vena cava is normal in size with greater than 50% respiratory variability, suggesting right atrial pressure of 3 mmHg. FINDINGS  Left Ventricle: Left ventricular ejection fraction, by estimation, is 60 to 65%. The left ventricle has normal function. The left ventricle has no regional wall motion abnormalities. The left  ventricular internal cavity size was normal in size. There is  no left ventricular hypertrophy. Left ventricular diastolic parameters were normal. Right Ventricle: The right ventricular size is mildly enlarged. Right ventricular systolic function is normal. Left Atrium: Left atrial size was normal in size. Right Atrium: Right atrial size was mildly dilated. Pericardium: There is no evidence of pericardial effusion. Mitral Valve: The mitral valve is normal in structure. Trivial mitral valve regurgitation. No evidence of mitral valve stenosis. Tricuspid Valve: The tricuspid valve is normal in structure. Tricuspid valve regurgitation is trivial. No evidence of tricuspid stenosis. Aortic Valve: The aortic valve is tricuspid. Aortic valve regurgitation is not visualized. No aortic stenosis is present. Aortic valve mean gradient measures 4.0 mmHg. Aortic valve peak gradient measures 7.3 mmHg. Aortic valve area,  by VTI measures 2.38 cm. Pulmonic Valve: The pulmonic valve was normal in structure. Pulmonic valve regurgitation is not visualized. No evidence of pulmonic stenosis. Aorta: The aortic root is normal in size and structure. Venous: The inferior vena cava is normal in size with greater than 50% respiratory variability, suggesting right atrial pressure of 3 mmHg. IAS/Shunts: No atrial level shunt detected by color flow Doppler.  LEFT VENTRICLE PLAX 2D LVIDd:         5.10 cm   Diastology LVIDs:         2.90 cm   LV e' medial:    7.94 cm/s LV PW:         0.90 cm   LV E/e' medial:  7.3 LV IVS:        0.90 cm   LV e' lateral:   12.50 cm/s LVOT diam:     1.90 cm   LV E/e' lateral: 4.6 LV SV:         62 LV SV Index:   32 LVOT Area:     2.84 cm  RIGHT VENTRICLE             IVC RV Basal diam:  4.80 cm     IVC diam: 1.70 cm RV Mid diam:    4.00 cm RV S prime:     21.40 cm/s TAPSE (M-mode): 2.9 cm LEFT ATRIUM             Index        RIGHT ATRIUM           Index LA diam:        3.90 cm 1.98 cm/m   RA Area:     22.30 cm LA  Vol (A2C):   36.1 ml 18.35 ml/m  RA Volume:   71.60 ml  36.39 ml/m LA Vol (A4C):   25.9 ml 13.16 ml/m LA Biplane Vol: 30.7 ml 15.60 ml/m  AORTIC VALVE AV Area (Vmax):    2.51 cm AV Area (Vmean):   2.38 cm AV Area (VTI):     2.38 cm AV Vmax:           135.00 cm/s AV Vmean:          88.900 cm/s AV VTI:            0.261 m AV Peak Grad:      7.3 mmHg AV Mean Grad:      4.0 mmHg LVOT Vmax:         119.50 cm/s LVOT Vmean:        74.700 cm/s LVOT VTI:          0.219 m LVOT/AV VTI ratio: 0.84  AORTA Ao Root diam: 3.10 cm Ao Asc diam:  3.00 cm MITRAL VALVE MV Area (PHT): 4.39 cm    SHUNTS MV Decel Time: 173 msec    Systemic VTI:  0.22 m MV E velocity: 57.80 cm/s  Systemic Diam: 1.90 cm MV A velocity: 60.40 cm/s MV E/A ratio:  0.96 Olga Millers MD Electronically signed by Olga Millers MD Signature Date/Time: 05/01/2021/12:34:59 PM    Final    CT HEAD CODE STROKE WO CONTRAST  Result Date: 04/30/2021 CLINICAL DATA:  Code stroke.  Neuro deficit, acute, stroke suspected EXAM: CT HEAD WITHOUT CONTRAST TECHNIQUE: Contiguous axial images were obtained from the base of the skull through the vertex without intravenous contrast. COMPARISON:  None. FINDINGS: Brain: No evidence of acute large vascular territory infarction, hemorrhage, hydrocephalus, extra-axial collection or mass lesion/mass  effect. Vascular: No hyperdense vessel identified. Skull: No acute fracture. Sinuses/Orbits: No acute finding. Other: No mastoid effusions. ASPECTS [facility] Early CT Score) total score (0-10 with 10 being normal): 10. IMPRESSION: No evidence of acute large vascular territory infarct or acute hemorrhage. ASPECTS is 10. Code stroke imaging results were communicated on 04/30/2021 at 9:12 am to provider Dr. Otelia Limes via secure text paging. Electronically Signed   By: Feliberto Harts M.D.   On: 04/30/2021 09:14   VAS US CAROTID  Result Date: 04/30/2021 Carotid Arterial Duplex Study Patient Name:  Steve Dickerson  Date  of Exam:   04/30/2021 Medical Rec #: [id number]        Accession #:    [id number] Date of Birth: [birth year]         Patient Gender: M Patient Age:   81 years Exam Location:  [facility] Procedure:      VAS US CAROTID Referring Phys: Mikey College --------------------------------------------------------------------------------  Indications:       CVA and Numbness. Comparison Study:  No prior study Performing Technologist: Sherren Kerns RVS  Examination Guidelines: A complete evaluation includes B-mode imaging, spectral Doppler, color Doppler, and power Doppler as needed of all accessible portions of each vessel. Bilateral testing is considered an integral part of a complete examination. Limited examinations for reoccurring indications may be performed as noted.  Right Carotid Findings: +----------+--------+--------+--------+------------------+------------------+           PSV cm/sEDV cm/sStenosisPlaque DescriptionComments           +----------+--------+--------+--------+------------------+------------------+ CCA Prox  164     22                                intimal thickening +----------+--------+--------+--------+------------------+------------------+ CCA Distal164     33                                intimal thickening +----------+--------+--------+--------+------------------+------------------+ ICA Prox  111     31                                                   +----------+--------+--------+--------+------------------+------------------+ ICA Distal102     37                                                   +----------+--------+--------+--------+------------------+------------------+ ECA       129     16                                                   +----------+--------+--------+--------+------------------+------------------+ +----------+--------+-------+--------+-------------------+           PSV cm/sEDV cmsDescribeArm Pressure (mmHG)  +----------+--------+-------+--------+-------------------+ [id number]                                        +----------+--------+-------+--------+-------------------+ +---------+--------+--+--------+--+ VertebralPSV cm/s71EDV cm/s17 +---------+--------+--+--------+--+  Left Carotid Findings: +----------+--------+--------+--------+------------------+------------------+  PSV cm/sEDV cm/sStenosisPlaque DescriptionComments           +----------+--------+--------+--------+------------------+------------------+ CCA Prox  144     21                                intimal thickening +----------+--------+--------+--------+------------------+------------------+ CCA Distal150     21                                intimal thickening +----------+--------+--------+--------+------------------+------------------+ ICA Prox  77      28                                                   +----------+--------+--------+--------+------------------+------------------+ ICA Distal69      22                                                   +----------+--------+--------+--------+------------------+------------------+ ECA       120     10                                                   +----------+--------+--------+--------+------------------+------------------+ +----------+--------+--------+--------+-------------------+           PSV cm/sEDV cm/sDescribeArm Pressure (mmHG) +----------+--------+--------+--------+-------------------+ [id number]                                          +----------+--------+--------+--------+-------------------+ +---------+--------+--+--------+--+ VertebralPSV cm/s55EDV cm/s13 +---------+--------+--+--------+--+   Summary: Right Carotid: There was no evidence of thrombus, dissection, atherosclerotic                plaque or stenosis in the cervical carotid system. Left Carotid: There is no evidence of stenosis in the left ICA.  Vertebrals:  Bilateral vertebral arteries demonstrate antegrade flow. Subclavians: Normal flow hemodynamics were seen in bilateral subclavian              arteries. *See table(s) above for measurements and observations.     Preliminary    VAS [place] LOWER EXTREMITY VENOUS (DVT)  Result Date: 05/01/2021  Lower Venous DVT Study Patient Name:  Steve Dickerson  Date of Exam:   05/01/2021 Medical Rec #: [id number]        Accession #:    [id number] Date of Birth: [birth year]         Patient Gender: M Patient Age:   63 years Exam Location:  [facility] Procedure:      VAS [place] LOWER EXTREMITY VENOUS (DVT) Referring Phys: Scheryl Marten XU --------------------------------------------------------------------------------  Indications: Patent foramen ovale, stroke.  Comparison Study: No prior studies. Performing Technologist: Jean Rosenthal RDMS, RVT  Examination Guidelines: A complete evaluation includes B-mode imaging, spectral Doppler, color Doppler, and power Doppler as needed of all accessible portions of each vessel. Bilateral testing is considered an integral part of a complete examination. Limited examinations for reoccurring indications may be performed as noted. The reflux portion of  the exam is performed with the patient in reverse Trendelenburg.  +---------+---------------+---------+-----------+----------+--------------+ RIGHT    CompressibilityPhasicitySpontaneityPropertiesThrombus Aging +---------+---------------+---------+-----------+----------+--------------+ CFV      Full           Yes      Yes                                 +---------+---------------+---------+-----------+----------+--------------+ SFJ      Full                                                        +---------+---------------+---------+-----------+----------+--------------+ FV Prox  Full                                                        +---------+---------------+---------+-----------+----------+--------------+ FV  Mid   Full                                                        +---------+---------------+---------+-----------+----------+--------------+ FV DistalFull                                                        +---------+---------------+---------+-----------+----------+--------------+ PFV      Full                                                        +---------+---------------+---------+-----------+----------+--------------+ POP      Full           Yes      Yes                                 +---------+---------------+---------+-----------+----------+--------------+ PTV      Full                                                        +---------+---------------+---------+-----------+----------+--------------+ PERO     Full                                                        +---------+---------------+---------+-----------+----------+--------------+ Gastroc  Full                                                        +---------+---------------+---------+-----------+----------+--------------+   +---------+---------------+---------+-----------+----------+--------------+  LEFT     CompressibilityPhasicitySpontaneityPropertiesThrombus Aging +---------+---------------+---------+-----------+----------+--------------+ CFV      Full           Yes      Yes                                 +---------+---------------+---------+-----------+----------+--------------+ SFJ      Full                                                        +---------+---------------+---------+-----------+----------+--------------+ FV Prox  Full                                                        +---------+---------------+---------+-----------+----------+--------------+ FV Mid   Full                                                        +---------+---------------+---------+-----------+----------+--------------+ FV DistalFull                                                         +---------+---------------+---------+-----------+----------+--------------+ PFV      Full                                                        +---------+---------------+---------+-----------+----------+--------------+ POP      Full           Yes      Yes                                 +---------+---------------+---------+-----------+----------+--------------+ PTV      Full                                                        +---------+---------------+---------+-----------+----------+--------------+ PERO     Full                                                        +---------+---------------+---------+-----------+----------+--------------+ Gastroc  Partial                                                     +---------+---------------+---------+-----------+----------+--------------+  Summary: RIGHT: - There is no evidence of deep vein thrombosis in the lower extremity.  - No cystic structure found in the popliteal fossa.  LEFT: - There is no evidence of deep vein thrombosis in the lower extremity.  - No cystic structure found in the popliteal fossa.  *See table(s) above for measurements and observations. Electronically signed by Waverly Ferrari MD on 05/01/2021 at 3:08:18 PM.    Final    (Echo, Carotid, EGD, Colonoscopy, ERCP)    Subjective: No complain  Discharge Exam: Vitals:   05/01/21 0746 05/01/21 1456  BP: 131/87 129/86  Pulse: 71 90  Resp: 18 18  Temp: 97.8 F (36.6 C)   SpO2: 98% 99%   Vitals:   04/30/21 2009 05/01/21 0401 05/01/21 0746 05/01/21 1456  BP: (!) 137/92 128/88 131/87 129/86  Pulse: 72 88 71 90  Resp:  Temp:  97.7 F (36.5 C) 97.8 F (36.6 C)   TempSrc:   Oral   SpO2: 97% 98% 98% 99%    General: Pt is alert, awake, not in acute distress Cardiovascular: RRR, S1/S2 +, no rubs, no gallops Respiratory: CTA bilaterally, no wheezing, no rhonchi Abdominal: Soft, NT, ND, bowel sounds  + Extremities: no edema, no cyanosis    The results of significant diagnostics from this hospitalization (including imaging, microbiology, ancillary and laboratory) are listed below for reference.     Microbiology: Recent Results (from the past 240 hour(s))  Resp Panel by RT-PCR (Flu A&B, Covid) Nasopharyngeal Swab     Status: None   Collection Time: 04/30/21  8:55 AM   Specimen: Nasopharyngeal Swab; Nasopharyngeal(NP) swabs in vial transport medium  Result Value Ref Range Status   SARS Coronavirus 2 by RT PCR NEGATIVE NEGATIVE Final    Comment: (NOTE) SARS-CoV-2 target nucleic acids are NOT DETECTED.  The SARS-CoV-2 RNA is generally detectable in upper respiratory specimens during the acute phase of infection. The lowest concentration of SARS-CoV-2 viral copies this assay can detect is 138 copies/mL. A negative result does not preclude SARS-Cov-2 infection and should not be used as the sole basis for treatment or other patient management decisions. A negative result may occur with  improper specimen collection/handling, submission of specimen other than nasopharyngeal swab, presence of viral mutation(s) within the areas targeted by this assay, and inadequate number of viral copies(<138 copies/mL). A negative result must be combined with clinical observations, patient history, and epidemiological information. The expected result is Negative.  Fact Sheet for Patients:  BloggerCourse.com  Fact Sheet for Healthcare Providers:  SeriousBroker.it  This test is no t yet approved or cleared by the [place] FDA and  has been authorized for detection and/or diagnosis of SARS-CoV-2 by FDA under an Emergency Use Authorization (EUA). This EUA will remain  in effect (meaning this test can be used) for the duration of the COVID-19 declaration under Section 564(b)(1) of the Act, 21 U.S.C.section 360bbb-3(b)(1), unless the authorization  is terminated  or revoked sooner.       Influenza A by PCR NEGATIVE NEGATIVE Final   Influenza B by PCR NEGATIVE NEGATIVE Final    Comment: (NOTE) The Xpert Xpress SARS-CoV-2/FLU/RSV plus assay is intended as an aid in the diagnosis of influenza from Nasopharyngeal swab specimens and should not be used as a sole basis for treatment. Nasal washings and aspirates are unacceptable for Xpert Xpress SARS-CoV-2/FLU/RSV testing.  Fact Sheet for Patients: BloggerCourse.com  Fact Sheet for Healthcare Providers: SeriousBroker.it  This test is not yet approved  or cleared by the [place] and has been authorized for detection and/or diagnosis of SARS-CoV-2 by FDA under an Emergency Use Authorization (EUA). This EUA will remain in effect (meaning this test can be used) for the duration of the COVID-19 declaration under Section 564(b)(1) of the Act, 21 U.S.C. section 360bbb-3(b)(1), unless the authorization is terminated or revoked.  Performed at [facility], [address] [place], [place] [address]      Labs: BNP (last 3 results) No results for input(s): BNP in the last 8760 hours. Basic Metabolic Panel: Recent Labs  Lab 04/30/21 0900 04/30/21 0908  NA 140 140  K 4.6 4.6  CL 103 102  CO2 28  --   GLUCOSE 104* 102*  BUN 14 17  CREATININE 1.06 1.00  CALCIUM 9.6  --    Liver Function Tests: Recent Labs  Lab 04/30/21 0900  AST 28  ALT 26  ALKPHOS 78  BILITOT 0.7  PROT 7.4  ALBUMIN 4.3   No results for input(s): LIPASE, AMYLASE in the last 168 hours. No results for input(s): AMMONIA in the last 168 hours. CBC: Recent Labs  Lab 04/30/21 0900 04/30/21 0908  WBC 9.5  --   NEUTROABS 6.7  --   HGB 17.6* 18.4*  HCT 52.9* 54.0*  MCV 90.0  --   PLT 386  --    Cardiac Enzymes: No results for input(s): CKTOTAL, CKMB, CKMBINDEX, TROPONINI in the last 168 hours. BNP: Invalid input(s):  POCBNP CBG: Recent Labs  Lab 04/30/21 0857  GLUCAP 111*   D-Dimer No results for input(s): DDIMER in the last 72 hours. Hgb A1c Recent Labs    05/01/21 0155  HGBA1C 5.5   Lipid Profile Recent Labs    05/01/21 0155  CHOL 235*  HDL 59  LDLCALC 158*  TRIG 90  CHOLHDL 4.0   Thyroid function studies Recent Labs    05/01/21 0155  TSH 4.203   Anemia work up Recent Labs    05/01/21 0155  VITAMINB12 380   Urinalysis    Component Value Date/Time   COLORURINE YELLOW 04/30/2021 0855   APPEARANCEUR CLEAR 04/30/2021 0855   LABSPEC 1.008 04/30/2021 0855   PHURINE 7.0 04/30/2021 0855   GLUCOSEU NEGATIVE 04/30/2021 0855   GLUCOSEU NEGATIVE 02/08/2018 1225   HGBUR SMALL (A) 04/30/2021 0855   BILIRUBINUR NEGATIVE 04/30/2021 0855   KETONESUR NEGATIVE 04/30/2021 0855   PROTEINUR NEGATIVE 04/30/2021 0855   UROBILINOGEN 0.2 02/08/2018 1225   NITRITE NEGATIVE 04/30/2021 0855   LEUKOCYTESUR NEGATIVE 04/30/2021 0855   Sepsis Labs Invalid input(s): PROCALCITONIN,  WBC,  LACTICIDVEN Microbiology Recent Results (from the past 240 hour(s))  Resp Panel by RT-PCR (Flu A&B, Covid) Nasopharyngeal Swab     Status: None   Collection Time: 04/30/21  8:55 AM   Specimen: Nasopharyngeal Swab; Nasopharyngeal(NP) swabs in vial transport medium  Result Value Ref Range Status   SARS Coronavirus 2 by RT PCR NEGATIVE NEGATIVE Final    Comment: (NOTE) SARS-CoV-2 target nucleic acids are NOT DETECTED.  The SARS-CoV-2 RNA is generally detectable in upper respiratory specimens during the acute phase of infection. The lowest concentration of SARS-CoV-2 viral copies this assay can detect is 138 copies/mL. A negative result does not preclude SARS-Cov-2 infection and should not be used as the sole basis for treatment or other patient management decisions. A negative result may occur with  improper specimen collection/handling, submission of specimen other than nasopharyngeal swab, presence of  viral mutation(s) within the areas targeted  by this assay, and inadequate number of viral copies(<138 copies/mL). A negative result must be combined with clinical observations, patient history, and epidemiological information. The expected result is Negative.  Fact Sheet for Patients:  BloggerCourse.com  Fact Sheet for Healthcare Providers:  SeriousBroker.it  This test is no t yet approved or cleared by the [place] FDA and  has been authorized for detection and/or diagnosis of SARS-CoV-2 by FDA under an Emergency Use Authorization (EUA). This EUA will remain  in effect (meaning this test can be used) for the duration of the COVID-19 declaration under Section 564(b)(1) of the Act, 21 U.S.C.section 360bbb-3(b)(1), unless the authorization is terminated  or revoked sooner.       Influenza A by PCR NEGATIVE NEGATIVE Final   Influenza B by PCR NEGATIVE NEGATIVE Final    Comment: (NOTE) The Xpert Xpress SARS-CoV-2/FLU/RSV plus assay is intended as an aid in the diagnosis of influenza from Nasopharyngeal swab specimens and should not be used as a sole basis for treatment. Nasal washings and aspirates are unacceptable for Xpert Xpress SARS-CoV-2/FLU/RSV testing.  Fact Sheet for Patients: BloggerCourse.com  Fact Sheet for Healthcare Providers: SeriousBroker.it  This test is not yet approved or cleared by the [place] FDA and has been authorized for detection and/or diagnosis of SARS-CoV-2 by FDA under an Emergency Use Authorization (EUA). This EUA will remain in effect (meaning this test can be used) for the duration of the COVID-19 declaration under Section 564(b)(1) of the Act, 21 U.S.C. section 360bbb-3(b)(1), unless the authorization is terminated or revoked.  Performed at [facility], [address] [place], [place] [address]      Time coordinating  discharge: Over 30 minutes  SIGNED:   Marinda Elk, MD  [facility] 05/01/2021, 4:04 PM Pager   If 7PM-7AM, please contact night-coverage [email or web address] Password [email or web address]

## 2021-05-01 NOTE — Plan of Care (Signed)
  Problem: Education: Goal: Knowledge of General Education information will improve Description: Including pain rating scale, medication(s)/side effects and non-pharmacologic comfort measures 05/01/2021 1634 by Coralie Common, RN Outcome: Adequate for Discharge 05/01/2021 1212 by Coralie Common, RN Outcome: Progressing   Problem: Health Behavior/Discharge Planning: Goal: Ability to manage health-related needs will improve 05/01/2021 1634 by Coralie Common, RN Outcome: Adequate for Discharge 05/01/2021 1212 by Coralie Common, RN Outcome: Progressing   Problem: Clinical Measurements: Goal: Ability to maintain clinical measurements within normal limits will improve 05/01/2021 1634 by Coralie Common, RN Outcome: Adequate for Discharge 05/01/2021 1212 by Coralie Common, RN Outcome: Progressing Goal: Will remain free from infection 05/01/2021 1634 by Coralie Common, RN Outcome: Adequate for Discharge 05/01/2021 1212 by Coralie Common, RN Outcome: Progressing Goal: Diagnostic test results will improve 05/01/2021 1634 by Coralie Common, RN Outcome: Adequate for Discharge 05/01/2021 1212 by Coralie Common, RN Outcome: Progressing Goal: Respiratory complications will improve 05/01/2021 1634 by Coralie Common, RN Outcome: Adequate for Discharge 05/01/2021 1212 by Coralie Common, RN Outcome: Progressing Goal: Cardiovascular complication will be avoided 05/01/2021 1634 by Coralie Common, RN Outcome: Adequate for Discharge 05/01/2021 1212 by Coralie Common, RN Outcome: Progressing   Problem: Coping: Goal: Level of anxiety will decrease 05/01/2021 1634 by Coralie Common, RN Outcome: Adequate for Discharge 05/01/2021 1212 by Coralie Common, RN Outcome: Progressing   Problem: Education: Goal: Knowledge of disease or condition will improve 05/01/2021 1634 by Coralie Common, RN Outcome: Adequate for Discharge 05/01/2021 1212 by Coralie Common,  RN Outcome: Progressing Goal: Knowledge of patient specific risk factors will improve (INDIVIDUALIZE FOR PATIENT) 05/01/2021 1634 by Coralie Common, RN Outcome: Adequate for Discharge 05/01/2021 1212 by Coralie Common, RN Outcome: Progressing   Problem: Ischemic Stroke/TIA Tissue Perfusion: Goal: Complications of ischemic stroke/TIA will be minimized 05/01/2021 1634 by Coralie Common, RN Outcome: Adequate for Discharge 05/01/2021 1212 by Coralie Common, RN Outcome: Progressing   Problem: Nutrition: Goal: Risk of aspiration will decrease 05/01/2021 1634 by Coralie Common, RN Outcome: Adequate for Discharge 05/01/2021 1212 by Coralie Common, RN Outcome: Progressing Goal: Dietary intake will improve 05/01/2021 1634 by Coralie Common, RN Outcome: Adequate for Discharge 05/01/2021 1212 by Coralie Common, RN Outcome: Progressing   Problem: Ischemic Stroke/TIA Tissue Perfusion: Goal: Complications of ischemic stroke/TIA will be minimized 05/01/2021 1634 by Coralie Common, RN Outcome: Adequate for Discharge 05/01/2021 1212 by Coralie Common, RN Outcome: Progressing

## 2021-05-01 NOTE — Progress Notes (Signed)
  Echocardiogram 2D Echocardiogram has been performed.  Delcie Roch 05/01/2021, 10:08 AM

## 2021-05-01 NOTE — Plan of Care (Signed)
  Problem: Ischemic Stroke/TIA Tissue Perfusion: Goal: Complications of ischemic stroke/TIA will be minimized Outcome: Progressing   Problem: Nutrition: Goal: Risk of aspiration will decrease Outcome: Progressing Goal: Dietary intake will improve Outcome: Progressing   Problem: Education: Goal: Knowledge of disease or condition will improve Outcome: Progressing Goal: Knowledge of patient specific risk factors will improve (INDIVIDUALIZE FOR PATIENT) Outcome: Progressing   Problem: Skin Integrity: Goal: Risk for impaired skin integrity will decrease Outcome: Progressing   Problem: Nutrition: Goal: Adequate nutrition will be maintained Outcome: Progressing   Problem: Coping: Goal: Level of anxiety will decrease Outcome: Progressing   Problem: Elimination: Goal: Will not experience complications related to bowel motility Outcome: Progressing Goal: Will not experience complications related to urinary retention Outcome: Progressing

## 2021-05-02 LAB — LUPUS ANTICOAGULANT PANEL
DRVVT: 37.6 s (ref 0.0–47.0)
PTT Lupus Anticoagulant: 38.7 s (ref 0.0–51.9)

## 2021-05-02 LAB — BETA-2-GLYCOPROTEIN I ABS, IGG/M/A
Beta-2 Glyco I IgG: <9 GPI IgG units (ref 0–20)
Beta-2-Glycoprotein I IgA: <9 GPI IgA units (ref 0–25)
Beta-2-Glycoprotein I IgM: <9 GPI IgM units (ref 0–32)

## 2021-05-02 LAB — HOMOCYSTEINE: Homocysteine: 7.4 umol/L (ref 0.0–14.5)

## 2021-05-04 ENCOUNTER — Telehealth: Payer: Self-pay

## 2021-05-04 ENCOUNTER — Other Ambulatory Visit: Payer: Self-pay

## 2021-05-04 ENCOUNTER — Telehealth (HOSPITAL_COMMUNITY): Payer: Self-pay | Admitting: Pharmacist

## 2021-05-04 DIAGNOSIS — Q2112 Patent foramen ovale: Secondary | ICD-10-CM

## 2021-05-04 DIAGNOSIS — I639 Cerebral infarction, unspecified: Secondary | ICD-10-CM

## 2021-05-04 LAB — CARDIOLIPIN ANTIBODIES, IGG, IGM, IGA
Anticardiolipin IgA: <9 APL U/mL (ref 0–11)
Anticardiolipin IgG: <9 GPL U/mL (ref 0–14)
Anticardiolipin IgM: <9 MPL U/mL (ref 0–12)

## 2021-05-04 NOTE — Progress Notes (Signed)
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 Hyperlipidemia  1  Secondary stroke prevention: - Continue DAPT for 3 weeks, followed by ASA 81mg  daily alone. - Rosuvastatin 20mg  daily.  LDL goal less than 70 - Hgb A1c goal less than 7 - Normotensive blood pressure - Mediterranean diet 2  Follow up for TEE and with cardiology for consideration of PFO closure. 3  Follow up with me in 6 months or as needed.   Subjective:  Steve Dickerson is a 40 year old right-handed male who presents for stroke.  History supplemented by hospital notes.  CT head, MRI brain and MRA of head personally reviewed.  On the evening of 04/29/2021, he noted some numbness and tingling in his left hand and forearm.  It seemed to improve later that night.  The next morning, he noticed numbness in the right side of his face and leg.  He noticed some slight weakness as well.  He went to [facility] ED where CT head was unremarkable but MRI of brain showed small acute infarct in the left semi ovale.  MRA of head showed no intracranial stenosis.  Carotid doppler revealed no hemodynamically significant stenosis.  2D echocardiogram showed EF 60-65% with no cardiac source of embolus but transcranial doppler bubble study showed Spencer degree 2 at rest and 4 with Valsalva.  Lower extremity venous doppler was negative for DVT.  LDL was 158 and Hgb A1c was 5.5.  He was started on ASA 81mg  and Plavix 75mg  daily as well as atorvastatin 40mg  daily.  He was referred to Dr. 05/01/2021 of cardiology for consideration of PFO closure.  He is scheduled for a TEE on Friday.  At this time, he really only notices some paresthesias involving the right upper extremity.  He also notes a delay in muscle recruitment when he  tries to use his right upper extremity.  He has a mild left posterior headache.  Sometimes, headache and right sided numbness/tingling fluctuates.   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 Swallow whole. 30 tablet 11   busPIRone (BUSPAR) 5 MG tablet Take 1 tablet (5 mg total) by mouth 3 (three) times daily. (Patient not taking: No sig reported) 90 tablet 5   clopidogrel (PLAVIX) 75 MG tablet Take 1 tablet (75 mg total) by mouth daily. 30 tablet 3   multivitamin (ONE-A-DAY MEN'S) TABS tablet Take 1 tablet by mouth 3 (three) times a week.     rosuvastatin (CRESTOR) 20 MG tablet Take 1 tablet (20 mg total) by mouth daily. 30 tablet 11   venlafaxine XR (EFFEXOR XR) 37.5 MG 24 hr capsule Take 1 capsule (37.5 mg total) by mouth  daily with breakfast. 90 capsule 1   venlafaxine XR (EFFEXOR-XR) 37.5 MG 24 hr capsule TAKE 1 CAPSULE BY MOUTH ONCE A DAY WITH BREAKFAST (Patient not taking: Reported on 04/30/2021) 90 capsule 1   VITAMIN D3 SUPER STRENGTH 50 MCG (2000 UT) CAPS Take 2,000 Units by mouth 3 (three) times a week.     No current facility-administered medications on file prior to visit.    ALLERGIES: No Known Allergies  FAMILY HISTORY: Family History  Problem Relation Age of Onset   Hypertension Mother    COPD Mother    Asthma Mother    Diabetes Father    Hyperlipidemia Father    Hypertension Father    Alcohol abuse Father    Polycystic  ovary syndrome Sister    ADD / ADHD Brother    Mental illness Maternal Aunt    Dementia Maternal Grandmother    Lymphoma Maternal Grandmother    Parkinson's disease Maternal Grandfather    Dementia Paternal Grandmother    Heart disease Paternal Grandfather        died of MI at 63    Objective:  Blood pressure 138/86, pulse 63, height 6' (1.829 m), weight 172 lb (78 kg), SpO2 98 %. General: No acute distress.  Patient appears well-groomed.   Head:  Normocephalic/atraumatic Eyes:  fundi examined but not visualized Neck: supple, no paraspinal tenderness, full range of motion Back: No paraspinal tenderness Heart: regular rate and rhythm Lungs: Clear to auscultation bilaterally. Vascular: No carotid bruits. Neurological Exam: Mental status: alert and oriented to person, place, and time, recent and remote memory intact, fund of knowledge intact, attention and concentration intact, speech fluent and not dysarthric, language intact. Cranial nerves: CN I: not tested CN II: pupils equal, round and reactive to light, visual fields intact CN III, IV, VI:  full range of motion, no nystagmus, no ptosis CN V: mildly reduced right V1-V2 CN VII: upper and lower face symmetric CN VIII: hearing intact CN IX, X: gag intact, uvula midline CN XI: sternocleidomastoid and trapezius muscles intact CN XII: tongue midline Bulk & Tone: normal, no fasciculations. Motor:  muscle strength 5/5 throughout Sensation:  Pinprick sensation slightly reduced in right upper and lower extremities.  Vibratory sensation intact. Deep Tendon Reflexes:  3+ throughout,  toes downgoing.   Finger to nose testing:  Without dysmetria.   Heel to shin:  Without dysmetria.   Gait:  Normal station and stride.  Mild difficulty with tandem walk.  Romberg negative.    Thank you for allowing me to take part in the care of this patient.  Shon Millet, DO  CC: Cheryll Cockayne, MD

## 2021-05-04 NOTE — Telephone Encounter (Signed)
Pharmacy Transitions of Care Follow-up Telephone Call  Date of discharge: 05/01/21  Discharge Diagnosis: CVA  How have you been since you were released from the hospital? Good, getting better   Medication changes made at discharge:  - START: Plavix, Rosuvastatin, low dose asa  - STOPPED: fluconazole  - CHANGED: None  Medication changes verified by the patient? Yes (Yes/No)    Medication Accessibility:  Home Pharmacy: [facility]   Was the patient provided with refills on discharged medications? Yes   Have all prescriptions been transferred from [facility] to home pharmacy? N/A   Is the patient able to afford medications? Yes  Medication Review:  CLOPIDOGREL (PLAVIX) Clopidogrel 75 mg once daily.  - Educated patient on expected duration of therapy of  with clopidogrel. Advised patient that aspirin will be continued indefinitely.  - Reviewed potential DDIs with patient  - Advised patient of medications to avoid (NSAIDs, ASA)  - Educated that Tylenol (acetaminophen) will be the preferred analgesic to prevent risk of bleeding  - Emphasized importance of monitoring for signs and symptoms of bleeding (abnormal bruising, prolonged bleeding, nose bleeds, bleeding from gums, discolored urine, black tarry stools)  - Advised patient to alert all providers of anticoagulation therapy prior to starting a new medication or having a procedure    Follow-up Appointments:  [facility] Hospital f/u appt confirmed? YES Scheduled to see Jaffe on 05/05/21 @ 12:50.   If their condition worsens, is the pt aware to call PCP or go to the Emergency Dept.? Yes   Final Patient Assessment: Pt reports he is doing well.

## 2021-05-04 NOTE — Telephone Encounter (Signed)
Per Drs. Seth Bake, scheduled the patient for TEE 05/08/2021 and visit with Dr. Excell Seltzer 05/11/2021. Dr. Excell Seltzer will see the patient in Short Stay prior to TEE to update H&P. He was grateful for call and agrees with plan.

## 2021-05-05 ENCOUNTER — Encounter: Payer: Self-pay | Admitting: Neurology

## 2021-05-05 ENCOUNTER — Other Ambulatory Visit: Payer: Self-pay

## 2021-05-05 ENCOUNTER — Ambulatory Visit (INDEPENDENT_AMBULATORY_CARE_PROVIDER_SITE_OTHER): Payer: 59 | Admitting: Neurology

## 2021-05-05 VITALS — BP 138/86 | HR 63 | Ht 72.0 in | Wt 172.0 lb

## 2021-05-05 DIAGNOSIS — I639 Cerebral infarction, unspecified: Secondary | ICD-10-CM

## 2021-05-05 NOTE — Patient Instructions (Addendum)
Continue aspirin 81mg  and Plavix 75mg  daily for 21 days, followed by aspirin 81mg  daily alone Continue rosuvastatin 20mg  daily.  LDL goal less than 70 Normotensive blood pressure Mediterranean diet (see below) Follow up for TEE and with Dr. afterwards. Follow up with me in 6 months.  Mediterranean Diet A Mediterranean diet refers to food and lifestyle choices that are based on the traditions of countries located on the . This way of eating has been shown to help prevent certain conditions and improve outcomes for people who have chronic diseases, like kidney disease and heart disease. What are tips for following this plan? Lifestyle Cook and eat meals together with your family, when possible. Drink enough fluid to keep your urine clear or pale yellow. Be physically active every day. This includes: Aerobic exercise like running or swimming. Leisure activities like gardening, walking, or housework. Get 7-8 hours of sleep each night. If recommended by your health care provider, drink red wine in moderation. This means 1 glass a day for nonpregnant women and 2 glasses a day for men. A glass of wine equals 5 oz (150 mL). Reading food labels  Check the serving size of packaged foods. For foods such as rice and pasta, the serving size refers to the amount of cooked product, not dry. Check the total fat in packaged foods. Avoid foods that have saturated fat or trans fats. Check the ingredients list for added sugars, such as corn syrup. Shopping At the grocery store, buy most of your food from the areas near the walls of the store. This includes: Fresh fruits and vegetables (produce). Grains, beans, nuts, and seeds. Some of these may be available in unpackaged forms or large amounts (in bulk). Fresh seafood. Poultry and eggs. Low-fat dairy products. Buy whole ingredients instead of prepackaged foods. Buy fresh fruits and vegetables in-season from local farmers  markets. Buy frozen fruits and vegetables in resealable bags. If you do not have access to quality fresh seafood, buy precooked frozen shrimp or canned fish, such as tuna, salmon, or sardines. Buy small amounts of raw or cooked vegetables, salads, or olives from the deli or salad bar at your store. Stock your pantry so you always have certain foods on hand, such as olive oil, canned tuna, canned tomatoes, rice, pasta, and beans. Cooking Cook foods with extra-virgin olive oil instead of using butter or other vegetable oils. Have meat as a side dish, and have vegetables or grains as your main dish. This means having meat in small portions or adding small amounts of meat to foods like pasta or stew. Use beans or vegetables instead of meat in common dishes like chili or lasagna. Experiment with different cooking methods. Try roasting or broiling vegetables instead of steaming or sauteing them. Add frozen vegetables to soups, stews, pasta, or rice. Add nuts or seeds for added healthy fat at each meal. You can add these to yogurt, salads, or vegetable dishes. Marinate fish or vegetables using olive oil, lemon juice, garlic, and fresh herbs. Meal planning  Plan to eat 1 vegetarian meal one day each week. Try to work up to 2 vegetarian meals, if possible. Eat seafood 2 or more times a week. Have healthy snacks readily available, such as: Vegetable sticks with hummus. Greek yogurt. Fruit and nut trail mix. Eat balanced meals throughout the week. This includes: Fruit: 2-3 servings a day Vegetables: 4-5 servings a day Low-fat dairy: 2 servings a day Fish, poultry, or lean meat: 1 serving a day  Beans and legumes: 2 or more servings a week Nuts and seeds: 1-2 servings a day Whole grains: 6-8 servings a day Extra-virgin olive oil: 3-4 servings a day Limit red meat and sweets to only a few servings a month What are my food choices? Mediterranean diet Recommended Grains: Whole-grain pasta. Brown  rice. Bulgar wheat. Polenta. Couscous. Whole-wheat bread. Orpah Cobb. Vegetables: Artichokes. Beets. Broccoli. Cabbage. Carrots. Eggplant. Green beans. Chard. Kale. Spinach. Onions. Leeks. Peas. Squash. Tomatoes. Peppers. Radishes. Fruits: Apples. Apricots. Avocado. Berries. Bananas. Cherries. Dates. Figs. Grapes. Lemons. Melon. Oranges. Peaches. Plums. Pomegranate. Meats and other protein foods: Beans. Almonds. Sunflower seeds. Pine nuts. Peanuts. Cod. Salmon. Scallops. Shrimp. Tuna. Tilapia. Clams. Oysters. Eggs. Dairy: Low-fat milk. Cheese. Greek yogurt. Beverages: Water. Red wine. Herbal tea. Fats and oils: Extra virgin olive oil. Avocado oil. Grape seed oil. Sweets and desserts: [place] yogurt with honey. Baked apples. Poached pears. Trail mix. Seasoning and other foods: Basil. Cilantro. Coriander. Cumin. Mint. Parsley. Sage. Rosemary. Tarragon. Garlic. Oregano. Thyme. Pepper. Balsalmic vinegar. Tahini. Hummus. Tomato sauce. Olives. Mushrooms. Limit these Grains: Prepackaged pasta or rice dishes. Prepackaged cereal with added sugar. Vegetables: Deep fried potatoes (french fries). Fruits: Fruit canned in syrup. Meats and other protein foods: Beef. Pork. Lamb. Poultry with skin. Hot dogs. Tomasa Blase. Dairy: Ice cream. Sour cream. Whole milk. Beverages: Juice. Sugar-sweetened soft drinks. Beer. Liquor and spirits. Fats and oils: Butter. Canola oil. Vegetable oil. Beef fat (tallow). Lard. Sweets and desserts: Cookies. Cakes. Pies. Candy. Seasoning and other foods: Mayonnaise. Premade sauces and marinades. The items listed may not be a complete list. Talk with your dietitian about what dietary choices are right for you. Summary The Mediterranean diet includes both food and lifestyle choices. Eat a variety of fresh fruits and vegetables, beans, nuts, seeds, and whole grains. Limit the amount of red meat and sweets that you eat. Talk with your health care provider about whether it is safe for you  to drink red wine in moderation. This means 1 glass a day for nonpregnant women and 2 glasses a day for men. A glass of wine equals 5 oz (150 mL). This information is not intended to replace advice given to you by your health care provider. Make sure you discuss any questions you have with your health care provider. Document Revised: 02/26/2016 Document Reviewed: 02/19/2016 Elsevier Patient Education  2020 [employer].

## 2021-05-06 NOTE — Telephone Encounter (Signed)
Tentatively scheduled the patient for PFO closure 05/13/2021 with Dr. Excell Seltzer.  He understands the procedure will be kept or cancelled based on TEE results and meeting with Dr. Excell Seltzer.

## 2021-05-08 ENCOUNTER — Encounter (HOSPITAL_COMMUNITY): Payer: Self-pay | Admitting: Internal Medicine

## 2021-05-08 ENCOUNTER — Ambulatory Visit (HOSPITAL_BASED_OUTPATIENT_CLINIC_OR_DEPARTMENT_OTHER)
Admission: RE | Admit: 2021-05-08 | Discharge: 2021-05-08 | Disposition: A | Discharge status: Home / Self Care | Payer: 59 | Source: Home / Self Care | Attending: Cardiovascular Disease | Admitting: Cardiovascular Disease

## 2021-05-08 ENCOUNTER — Other Ambulatory Visit: Payer: Self-pay

## 2021-05-08 ENCOUNTER — Ambulatory Visit (HOSPITAL_COMMUNITY): Payer: 59 | Admitting: Anesthesiology

## 2021-05-08 ENCOUNTER — Ambulatory Visit (HOSPITAL_COMMUNITY)
Admission: RE | Admit: 2021-05-08 | Discharge: 2021-05-08 | Disposition: A | Discharge status: Home / Self Care | Payer: 59 | Attending: Internal Medicine | Admitting: Internal Medicine

## 2021-05-08 ENCOUNTER — Encounter (HOSPITAL_COMMUNITY)
Admission: RE | Disposition: A | Discharge status: Home / Self Care | Payer: Self-pay | Source: Home / Self Care | Attending: Internal Medicine

## 2021-05-08 DIAGNOSIS — Z7982 Long term (current) use of aspirin: Secondary | ICD-10-CM | POA: Diagnosis not present

## 2021-05-08 DIAGNOSIS — I639 Cerebral infarction, unspecified: Secondary | ICD-10-CM | POA: Insufficient documentation

## 2021-05-08 DIAGNOSIS — Z7902 Long term (current) use of antithrombotics/antiplatelets: Secondary | ICD-10-CM | POA: Insufficient documentation

## 2021-05-08 DIAGNOSIS — B351 Tinea unguium: Secondary | ICD-10-CM | POA: Diagnosis not present

## 2021-05-08 DIAGNOSIS — F419 Anxiety disorder, unspecified: Secondary | ICD-10-CM | POA: Diagnosis not present

## 2021-05-08 DIAGNOSIS — Q2112 Patent foramen ovale: Secondary | ICD-10-CM

## 2021-05-08 DIAGNOSIS — Z79899 Other long term (current) drug therapy: Secondary | ICD-10-CM | POA: Diagnosis not present

## 2021-05-08 DIAGNOSIS — I081 Rheumatic disorders of both mitral and tricuspid valves: Secondary | ICD-10-CM | POA: Diagnosis not present

## 2021-05-08 HISTORY — PX: TEE WITHOUT CARDIOVERSION: SHX5443

## 2021-05-08 HISTORY — PX: BUBBLE STUDY: SHX6837

## 2021-05-08 SURGERY — ECHOCARDIOGRAM, TRANSESOPHAGEAL
Anesthesia: Monitor Anesthesia Care

## 2021-05-08 MED ORDER — SODIUM CHLORIDE 0.9 % IV SOLN: INTRAVENOUS | Status: DC

## 2021-05-08 MED ORDER — LIDOCAINE HCL (CARDIAC) PF 100 MG/5ML IV SOSY
PREFILLED_SYRINGE | INTRAVENOUS | Status: DC | PRN
  Administered 2021-05-08 (×2): 100 mg via INTRATRACHEAL

## 2021-05-08 MED ORDER — PROPOFOL 10 MG/ML IV BOLUS
INTRAVENOUS | Status: DC | PRN
  Administered 2021-05-08 (×2): 30 mg via INTRAVENOUS
  Administered 2021-05-08: 10 mg via INTRAVENOUS
  Administered 2021-05-08: 20 mg via INTRAVENOUS
  Administered 2021-05-08: 30 mg via INTRAVENOUS
  Administered 2021-05-08: 20 mg via INTRAVENOUS

## 2021-05-08 MED ORDER — PROPOFOL 500 MG/50ML IV EMUL
INTRAVENOUS | Status: DC | PRN
  Administered 2021-05-08: 100 ug/kg/min via INTRAVENOUS

## 2021-05-08 MED ORDER — BUTAMBEN-TETRACAINE-BENZOCAINE 2-2-14 % EX AERO
INHALATION_SPRAY | CUTANEOUS | Status: DC | PRN
  Administered 2021-05-08: 2 via TOPICAL

## 2021-05-08 NOTE — Anesthesia Preprocedure Evaluation (Signed)
Anesthesia Evaluation  Patient identified by MRN, date of birth, ID band Patient awake    Reviewed: Allergy & Precautions, H&P , NPO status , Patient's Chart, lab work & pertinent test results  Airway Mallampati: II   Neck ROM: full    Dental   Pulmonary asthma ,    breath sounds clear to auscultation       Cardiovascular  Rhythm:regular Rate:Normal  PFO   Neuro/Psych PSYCHIATRIC DISORDERS Anxiety CVA    GI/Hepatic   Endo/Other    Renal/GU      Musculoskeletal   Abdominal   Peds  Hematology   Anesthesia Other Findings   Reproductive/Obstetrics                             Anesthesia Physical Anesthesia Plan  ASA: 2  Anesthesia Plan: MAC   Post-op Pain Management:    Induction: Intravenous  PONV Risk Score and Plan: 1 and Propofol infusion and Treatment may vary due to age or medical condition  Airway Management Planned: Nasal Cannula  Additional Equipment:   Intra-op Plan:   Post-operative Plan:   Informed Consent: I have reviewed the patients History and Physical, chart, labs and discussed the procedure including the risks, benefits and alternatives for the proposed anesthesia with the patient or authorized representative who has indicated his/her understanding and acceptance.     Dental advisory given  Plan Discussed with: CRNA, Anesthesiologist and Surgeon  Anesthesia Plan Comments:         Anesthesia Quick Evaluation

## 2021-05-08 NOTE — CV Procedure (Signed)
INDICATIONS: Stroke, PFO  PROCEDURE:   Informed consent was obtained prior to the procedure. The risks, benefits and alternatives for the procedure were discussed and the patient comprehended these risks.  Risks include, but are not limited to, cough, sore throat, vomiting, nausea, somnolence, esophageal and stomach trauma or perforation, bleeding, low blood pressure, aspiration, pneumonia, infection, trauma to the teeth and death.    After a procedural time-out, the oropharynx was anesthetized with 20% benzocaine spray.   During this procedure the patient was administered propofol per anesthesia.  The patient's heart rate, blood pressure, and oxygen saturation were monitored continuously during the procedure. The period of conscious sedation was 30 minutes, of which I was present face-to-face 100% of this time.  The transesophageal probe was inserted in the esophagus and stomach without difficulty and multiple views were obtained.  The patient was kept under observation until the patient left the procedure room.  The patient left the procedure room in stable condition.   Agitated microbubble saline contrast was administered.  COMPLICATIONS:    There were no immediate complications.  FINDINGS:   FORMAL ECHOCARDIOGRAM REPORT PENDING Normal biventricular size and function. PFO with left to right shunt by color flow Doppler. Both TTE bubble study and TEE bubble study positive for right to left shunt with small amount of bubbles. TTE positive with Valsalva, TEE positive at rest.  No ASD.  Apparent normal variant pulmonary vein anatomy with 3 on the right, 2 on the left.  No valvular heart disease. Normal appearing coronary artery origins.  RECOMMENDATIONS:     Dismissal when alert. Findings conveyed to Dr. Excell Seltzer  Time Spent Directly with the Patient:  60 minutes   Steve Dickerson 05/08/2021, 8:24 AM

## 2021-05-08 NOTE — Discharge Instructions (Signed)

## 2021-05-08 NOTE — Transfer of Care (Signed)
Immediate Anesthesia Transfer of Care Note  Patient: Steve Dickerson  Procedure(s) Performed: TRANSESOPHAGEAL ECHOCARDIOGRAM (TEE) BUBBLE STUDY  Patient Location: Endoscopy Unit  Anesthesia Type:MAC  Level of Consciousness: drowsy and patient cooperative  Airway & Oxygen Therapy: Patient Spontanous Breathing and Patient connected to nasal cannula oxygen  Post-op Assessment: Report given to RN, Post -op Vital signs reviewed and stable and Patient moving all extremities  Post vital signs: Reviewed and stable  Last Vitals:  Vitals Value Taken Time  BP 110/57 05/08/21 0825  Temp    Pulse 63 05/08/21 0827  Resp 17 05/08/21 0827  SpO2 97 % 05/08/21 0827  Vitals shown include unvalidated device data.  Last Pain:  Vitals:   05/08/21 0652  TempSrc: Temporal  PainSc: 0-No pain         Complications: No notable events documented.

## 2021-05-08 NOTE — H&P (Signed)
Cardiology Admission History and Physical:   Patient ID: Steve Dickerson MRN: [id number]; DOB: 1980-09-13   Admission date: 05/08/2021  PCP:  Pincus Sanes, MD   [facility] Providers Cardiologist:  None        Chief Complaint: Stroke  Patient Profile:   Steve Dickerson is a 40 y.o. male with who is being seen 05/08/2021 for the evaluation of PFO.    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 He presented 05/01/21 with right sided numbness and clumsiness. He was found to have a small infarct in the left centrum semiovale without any significant large vessel or intracerebral stenosis. Transcranial doppler showed Spencer grade 4 R--->L shunt with valsalva. 2D echo showed no structural abnormalities. Today, he denies symptoms of chest pain, shortness of breath, orthopnea, PND, lower extremity edema, dizziness, or syncope.    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 Authorizing Provider  aspirin 81 MG EC tablet Take 1 tablet (81 mg total) by mouth daily. Swallow whole. 05/01/21  Yes Marinda Elk, MD  clopidogrel (PLAVIX) 75 MG tablet Take 1 tablet (75 mg total) by mouth daily. 05/01/21  Yes Marinda Elk, MD  multivitamin (ONE-A-DAY MEN'S) TABS tablet Take 1 tablet by mouth 3 (three) times a week.   Yes [provider]  rosuvastatin (CRESTOR) 20 MG tablet Take 1 tablet (20 mg total) by mouth daily. 05/01/21 05/01/22 Yes Marinda Elk, MD  venlafaxine XR (EFFEXOR XR) 37.5 MG 24 hr capsule Take 1 capsule (37.5 mg total) by mouth daily with breakfast. 07/29/20   Yes Burns, Bobette Mo, MD  VITAMIN D3 SUPER STRENGTH 50 MCG (2000 UT) CAPS Take 2,000 Units by mouth 3 (three) times a week.   Yes [provider]  busPIRone (BUSPAR) 5 MG tablet Take 1 tablet (5 mg total) by mouth 3 (three) times daily. Patient taking differently: Take 5 mg by mouth 3 (three) times daily as needed (anxiety). 04/29/20   Pincus Sanes, MD     Allergies:   No Known Allergies  Social History:   Social History   Socioeconomic History   Marital status: Married    Spouse name: Not on file   Number of children: 2   Years of education: Not on file   Highest education level: Not on file  Occupational History   Not on file  Tobacco Use   Smoking status: Never   Smokeless tobacco: Never  Vaping Use   Vaping Use: Never used  Substance and Sexual Activity   Alcohol use: Yes    Comment: social   Drug use: No   Sexual activity: Not on file  Other Topics Concern   Not on file  Social History Narrative   Married, 2 kids   Exercises regularly   Social Determinants of Health   Financial Resource Strain: Not on file  Food Insecurity: Not on file  Transportation Needs: Not on file  Physical Activity: Not on file  Stress: Not on file  Social Connections: Not  on file  Intimate Partner Violence: Not on file    Family History:   The patient's family history includes ADD / ADHD in his brother; Alcohol abuse in his father; Asthma in his mother; COPD in his mother; Dementia in his maternal grandmother and paternal grandmother; Diabetes in his father; Heart disease in his paternal grandfather; Hyperlipidemia in his father; Hypertension in his father and mother; Lymphoma in his maternal grandmother; Mental illness in his maternal aunt; Parkinson's disease in his maternal grandfather; Polycystic ovary syndrome in his sister.    ROS:  Please see the history of present illness.  All other ROS reviewed and negative.     Physical Exam/Data:   Vitals:   05/08/21 0652  BP:  (!) 138/91  Pulse: 70  Resp: 17  Temp: 97.7 F (36.5 C)  TempSrc: Temporal  SpO2: 98%   No intake or output data in the 24 hours ending 05/08/21 0713 Last 3 Weights 05/05/2021 11/30/2019 02/08/2018  Weight (lbs) 172 lb 170 lb 169 lb  Weight (kg) 78.019 kg 77.111 kg 76.658 kg     There is no height or weight on file to calculate BMI.  General:  Well nourished, well developed, in no acute distress HEENT: normal Neck: no JVD Vascular: Distal pulses 2+ bilaterally   Cardiac:  normal S1, S2; RRR; no murmur  Lungs:  clear to auscultation bilaterally, no wheezing, rhonchi or rales  Abd: soft, nontender, no hepatomegaly  Ext: no edema Musculoskeletal:  No deformities, BUE and BLE strength normal and equal Skin: warm and dry  Psych:  Normal affect    EKG:  The ECG that was done 04/30/2021 was personally reviewed and demonstrates NSR 81 bpm, within normal limits  Relevant CV Studies: MRI Brain: IMPRESSION: 1. 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 2. Patent intracranial vasculature. No high-grade stenosis or occlusion.  Transcranial doppler: Between 10 and 15 high intensity transient signals (HITS) were observed at  rest, indicating a Spencer Grade 2 patent foramen ovale (PFO). A partial  curtain of high intensity transient signals (HITS) were observed with  valsalva, indicating a Spencer Grade   4 patent foramen ovale (PFO) with valsalva maneuver.   Positive TCD Bubble study indicative of a moderate size ( spencer grade 4  0 right to left shunt.  *See table(s) above for TCD measurements and observations.   Laboratory Data:  High Sensitivity Troponin:   Recent Labs  Lab 04/30/21 0900  TROPONINIHS <2      ChemistryNo results for input(s): NA, K, CL, CO2, GLUCOSE, BUN, CREATININE, CALCIUM, MG, GFRNONAA, GFRAA, ANIONGAP in the last 168 hours.  No results for input(s): PROT, ALBUMIN, AST, ALT, ALKPHOS, BILITOT in the last 168 hours. Lipids No  results for input(s): CHOL, TRIG, HDL, LABVLDL, LDLCALC, CHOLHDL in the last 168 hours. HematologyNo results for input(s): WBC, RBC, HGB, HCT, MCV, MCH, MCHC, RDW, PLT in the last 168 hours. Thyroid No results for input(s): TSH, FREET4 in the last 168 hours. BNPNo results for input(s): BNP, PROBNP in the last 168 hours.  DDimer No results for input(s): DDIMER in the last 168 hours.   Radiology/Studies:  No results found.   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 Hypercoagulable workup was negative. There is no atherosclerotic disease seen on MRA. He has no stroke risk factors. He presents today for transesophageal echo to assess for cardiac source of embolus. It is  likely that he has a PFO and TEE will help assess anatomic features and suitability for transcatheter closure. TEE risks, indications, and alternatives are reviewed with the patient and his wife who is present this morning. I will follow-up with him after the TEE is completed.    Risk Assessment/Risk Scores:     For questions or updates, please contact [facility] Please consult [email or web address] for contact info under     Signed, Tonny Bollman, MD  05/08/2021 7:13 AM

## 2021-05-08 NOTE — Interval H&P Note (Signed)
History and Physical Interval Note:  05/08/2021 7:47 AM  Steve Dickerson  has presented today for surgery, with the diagnosis of PFO.  The various methods of treatment have been discussed with the patient and family. After consideration of risks, benefits and other options for treatment, the patient has consented to  Procedure(s): TRANSESOPHAGEAL ECHOCARDIOGRAM (TEE) (N/A) as a surgical intervention.  The patient's history has been reviewed, patient examined, no change in status, stable for surgery.  I have reviewed the patient's chart and labs.  Questions were answered to the patient's satisfaction.     Parke Poisson

## 2021-05-08 NOTE — Progress Notes (Signed)
Echocardiogram Echocardiogram Transesophageal has been performed.  Warren Lacy Melbert Botelho RDCS 05/08/2021, 8:34 AM

## 2021-05-09 NOTE — Anesthesia Postprocedure Evaluation (Signed)
Anesthesia Post Note  Patient: Steve Dickerson  Procedure(s) Performed: TRANSESOPHAGEAL ECHOCARDIOGRAM (TEE) BUBBLE STUDY     Patient location during evaluation: Endoscopy Anesthesia Type: MAC Level of consciousness: awake and alert Pain management: pain level controlled Vital Signs Assessment: post-procedure vital signs reviewed and stable Respiratory status: spontaneous breathing, nonlabored ventilation, respiratory function stable and patient connected to nasal cannula oxygen Cardiovascular status: stable and blood pressure returned to baseline Postop Assessment: no apparent nausea or vomiting Anesthetic complications: no   No notable events documented.  Last Vitals:  Vitals:   05/08/21 0855 05/08/21 0916  BP: 115/75 114/76  Pulse: 65 (!) 56  Resp: 19 14  Temp:    SpO2: 99% 100%    Last Pain:  Vitals:   05/08/21 0825  TempSrc: Temporal  PainSc: 0-No pain                 Kennis Wissmann S

## 2021-05-11 ENCOUNTER — Other Ambulatory Visit: Payer: Self-pay

## 2021-05-11 ENCOUNTER — Encounter: Payer: Self-pay | Admitting: Cardiovascular Disease

## 2021-05-11 ENCOUNTER — Ambulatory Visit (INDEPENDENT_AMBULATORY_CARE_PROVIDER_SITE_OTHER): Payer: 59 | Admitting: Cardiovascular Disease

## 2021-05-11 VITALS — BP 130/90 | HR 70 | Ht 72.0 in | Wt 176.6 lb

## 2021-05-11 DIAGNOSIS — Q2112 Patent foramen ovale: Secondary | ICD-10-CM | POA: Diagnosis not present

## 2021-05-11 NOTE — H&P (View-Only) (Signed)
Cardiology Office Note:    Date:  05/11/2021   ID:  Steve Dickerson, DOB Aug 31, 1980, MRN [id number]  PCP:  Pincus Sanes, MD   [facility] Providers Cardiologist:  None     Referring MD: Pincus Sanes, MD   Chief Complaint  Patient presents with   PFO     History of Present Illness:    Steve Dickerson is a 40 y.o. male, previously healthy, presenting for evaluation of PFO.  On 04/29/2021 he developed acute numbness and tingling in his left hand and forearm.  He later noticed numbness on the right side of his face with associated weakness in the right arm.  He ultimately went to the [facility] emergency room and was found to have a small acute infarct in the left semiovale.  Evaluation in the hospital included an MRA that showed no intracranial or extracranial stenoses.  2D echo showed normal LVEF of 60 to 65%.  Transcranial Doppler was positive for Spencer degree to right to left shunt at rest and Spencer degree for right to left shunt with Valsalva.  Lower extremity venous Doppler was negative for DVT.  Hypercoagulable panel is negative.  The patient has been started on aspirin and clopidogrel.  He denies any history of chest pain, chest pressure, shortness of breath, edema, orthopnea, or PND.  He has had rare palpitations in the past.  Past Medical History:  Diagnosis Date   Anxiety    Effexor   Arthritis osteo both ankles    Asthma    childhood only    Past Surgical History:  Procedure Laterality Date   APPENDECTOMY Right 2003   club feet Bilateral    4 surgeries as a child    Current Medications: Current Meds  Medication Sig   aspirin 81 MG EC tablet Take 1 tablet (81 mg total) by mouth daily. Swallow whole.   clopidogrel (PLAVIX) 75 MG tablet Take 1 tablet (75 mg total) by mouth daily.   multivitamin (ONE-A-DAY MEN'S) TABS tablet Take 1 tablet by mouth 3 (three) times a week.   rosuvastatin (CRESTOR) 20 MG tablet Take 1 tablet (20 mg total) by mouth daily.    venlafaxine XR (EFFEXOR XR) 37.5 MG 24 hr capsule Take 1 capsule (37.5 mg total) by mouth daily with breakfast.   VITAMIN D3 SUPER STRENGTH 50 MCG (2000 UT) CAPS Take 2,000 Units by mouth 3 (three) times a week.     Allergies:   Patient has no known allergies.   Social History   Socioeconomic History   Marital status: Married    Spouse name: Not on file   Number of children: 2   Years of education: Not on file   Highest education level: Not on file  Occupational History   Not on file  Tobacco Use   Smoking status: Never   Smokeless tobacco: Never  Vaping Use   Vaping Use: Never used  Substance and Sexual Activity   Alcohol use: Yes    Comment: social   Drug use: No   Sexual activity: Not on file  Other Topics Concern   Not on file  Social History Narrative   Married, 2 kids   Exercises regularly   Social Determinants of Health   Financial Resource Strain: Not on file  Food Insecurity: Not on file  Transportation Needs: Not on file  Physical Activity: Not on file  Stress: Not on file  Social Connections: Not on file     Family History:  The patient's family history includes ADD / ADHD in his brother; Alcohol abuse in his father; Asthma in his mother; COPD in his mother; Dementia in his maternal grandmother and paternal grandmother; Diabetes in his father; Heart disease in his paternal grandfather; Hyperlipidemia in his father; Hypertension in his father and mother; Lymphoma in his maternal grandmother; Mental illness in his maternal aunt; Parkinson's disease in his maternal grandfather; Polycystic ovary syndrome in his sister.  ROS:   Please see the history of present illness.    All other systems reviewed and are negative.  EKGs/Labs/Other Studies Reviewed:    The following studies were reviewed today: TEE: FORMAL ECHOCARDIOGRAM REPORT PENDING Normal biventricular size and function. PFO with left to right shunt by color flow Doppler. Both TTE bubble study and  TEE bubble study positive for right to left shunt with small amount of bubbles. TTE positive with Valsalva, TEE positive at rest.  No ASD.  Apparent normal variant pulmonary vein anatomy with 3 on the right, 2 on the left.  No valvular heart disease. Normal appearing coronary artery origins.   TCD Bubble: Summary:      A vascular evaluation was performed. The right middle cerebral artery was  studied. An IV was inserted into the patient's right forearm. Verbal  informed consent was obtained.     Between 10 and 15 high intensity transient signals (HITS) were observed at  rest, indicating a Spencer Grade 2 patent foramen ovale (PFO). A partial  curtain of high intensity transient signals (HITS) were observed with  valsalva, indicating a Spencer Grade   4 patent foramen ovale (PFO) with valsalva maneuver.   Positive TCD Bubble study indicative of a moderate size ( spencer grade 4  0 right to left shunt.  *See table(s) above for TCD measurements and observations.   Brain MRI: IMPRESSION: 1. 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 2. Patent intracranial vasculature. No high-grade stenosis or occlusion.  EKG:  EKG is not ordered today.  The EKG from 04/30/2021 shows normal sinus rhythm with early repolarization pattern, otherwise within normal limits.  Recent Labs: 04/30/2021: ALT 26; BUN 17; Creatinine, Ser 1.00; Hemoglobin 18.4; Platelets 386; Potassium 4.6; Sodium 140 05/01/2021: TSH 4.203  Recent Lipid Panel    Component Value Date/Time   CHOL 235 (H) 05/01/2021 0155   TRIG 90 05/01/2021 0155   HDL 59 05/01/2021 0155   CHOLHDL 4.0 05/01/2021 0155   VLDL 18 05/01/2021 0155   LDLCALC 158 (H) 05/01/2021 0155   LDLCALC 112 (H) 04/12/2019 0646     Risk Assessment/Calculations:     ROPE Score = 9  Physical Exam:    VS:  BP 130/90   Pulse 70   Ht 6' (1.829 m)   Wt 176 lb 9.6 oz (80.1 kg)   SpO2 97%   BMI 23.95 kg/m     Wt Readings  from Last 3 Encounters:  05/11/21 176 lb 9.6 oz (80.1 kg)  05/05/21 172 lb (78 kg)  11/30/19 170 lb (77.1 kg)     GEN:  Well nourished, well developed physically fit male in no acute distress HEENT: Normal NECK: No JVD; No carotid bruits LYMPHATICS: No lymphadenopathy CARDIAC: RRR, no murmurs, rubs, gallops RESPIRATORY:  Clear to auscultation without rales, wheezing or rhonchi  ABDOMEN: Soft, non-tender, non-distended MUSCULOSKELETAL:  No edema; No deformity  SKIN: Warm and dry NEUROLOGIC:  Alert and oriented x 3 PSYCHIATRIC:  Normal affect   ASSESSMENT:    1. PFO (  patent foramen ovale)    PLAN:    In order of problems listed above:  Physically fit 40 year old male with recent cryptogenic stroke found to have PFO.  I personally reviewed the patient's transesophageal echo study.  This demonstrates a moderate PFO with baseline left to right flow through the PFO defect demonstrated on Doppler imaging.  The patient's agitated saline study is positive for right-to-left shunting as well.  There are no other significant cardiac abnormalities identified.  Stroke evaluation demonstrated no evidence of atherosclerotic disease.  Coagulation studies are normal with no evidence of anticardiolipin antibodies, lupus anticoagulant, or other heritable disorders.  I have reviewed the association between PFO and cryptogenic stroke with the patient today.  We discussed available evidence regarding transcatheter PFO closure versus ongoing medical therapy.  Risks, indications, and alternatives of transcatheter PFO closure discussed with the patient today.  He understands that serious risks include bleeding, vascular injury, stroke, myocardial infarction, arrhythmia, cardiac injury, cardiac tamponade, need for emergency pericardiocentesis, need for emergency cardiac surgery, device infection, device erosion, and death.  He understands the risk of serious complication is low, estimated at approximately 1% or less.   The patient provides full informed consent.  Medication Adjustments/Labs and Tests Ordered: Current medicines are reviewed at length with the patient today.  Concerns regarding medicines are outlined above.  No orders of the defined types were placed in this encounter.  No orders of the defined types were placed in this encounter.   Patient Instructions  CLOSURE INSTRUCTIONS (05/13/2021): You are scheduled for a PFO/ASD CLOSURE on Wednesday, November 2 with Dr. Sherren Mocha.  1. Please arrive at the [facility] (Main Entrance A) at [facility]: [address] [place], [place] [address] at 10:30 AM (This time is two hours before your procedure to ensure your preparation). Free valet parking service is available. You are allowed ONE visitor in the waiting room during your procedure. Both you and your guest must wear masks. Special note: Every effort is made to have your procedure done on time. Please understand that emergencies sometimes delay scheduled procedures.  2. Diet: Make sure to stay well hydrated the day before your procedure! Do not eat solid foods after midnight.  You may have clear liquids until 5am upon the day of the procedure.  3. Medication instructions in preparation for your procedure:  1) MAKE SURE TO TAKE YOUR ASPIRIN AND PLAVIX the morning of your closure  2) Other meds may be taken as directed with a sip of water.  4. Plan for one night stay--bring personal belongings (this is a disclaimer, not an intention. We want you to go home!). 5. Bring a current list of your medications and current insurance cards. 6. You MUST have a responsible person to drive you home. 7. Someone MUST be with you the first 24 hours after you arrive home or your discharge will be delayed. 8. Please wear clothes that are easy to get on and off and wear slip-on shoes.   FOLLOW-UP APPOINTMENT (06/22/2021): You are scheduled for your 1 month follow-up on Monday, December 12 at 12:00PM  with Kathyrn Drown, NP.. [address]   Signed, Sherren Mocha, MD  05/11/2021 2:14 PM    [facility]

## 2021-05-11 NOTE — Patient Instructions (Signed)
CLOSURE INSTRUCTIONS (05/13/2021): You are scheduled for a PFO/ASD CLOSURE on Wednesday, November 2 with Dr. Tonny Bollman.  1. Please arrive at the [facility] (Main Entrance A) at [facility]: [address] [place], [place] [address] at 10:30 AM (This time is two hours before your procedure to ensure your preparation). Free valet parking service is available. You are allowed ONE visitor in the waiting room during your procedure. Both you and your guest must wear masks. Special note: Every effort is made to have your procedure done on time. Please understand that emergencies sometimes delay scheduled procedures.  2. Diet: Make sure to stay well hydrated the day before your procedure! Do not eat solid foods after midnight.  You may have clear liquids until 5am upon the day of the procedure.  3. Medication instructions in preparation for your procedure:  1) MAKE SURE TO TAKE YOUR ASPIRIN AND PLAVIX the morning of your closure  2) Other meds may be taken as directed with a sip of water.  4. Plan for one night stay--bring personal belongings (this is a disclaimer, not an intention. We want you to go home!). 5. Bring a current list of your medications and current insurance cards. 6. You MUST have a responsible person to drive you home. 7. Someone MUST be with you the first 24 hours after you arrive home or your discharge will be delayed. 8. Please wear clothes that are easy to get on and off and wear slip-on shoes.   FOLLOW-UP APPOINTMENT (06/22/2021): You are scheduled for your 1 month follow-up on Monday, December 12 at 12:00PM with Georgie Chard, NP.. [address]

## 2021-05-11 NOTE — Progress Notes (Signed)
Cardiology Office Note:    Date:  05/11/2021   ID:  Steve Dickerson, DOB Aug 31, 1980, MRN [id number]  PCP:  Pincus Sanes, MD   [facility] Providers Cardiologist:  None     Referring MD: Pincus Sanes, MD   Chief Complaint  Patient presents with   PFO     History of Present Illness:    Steve Dickerson is a 40 y.o. male, previously healthy, presenting for evaluation of PFO.  On 04/29/2021 he developed acute numbness and tingling in his left hand and forearm.  He later noticed numbness on the right side of his face with associated weakness in the right arm.  He ultimately went to the [facility] emergency room and was found to have a small acute infarct in the left semiovale.  Evaluation in the hospital included an MRA that showed no intracranial or extracranial stenoses.  2D echo showed normal LVEF of 60 to 65%.  Transcranial Doppler was positive for Spencer degree to right to left shunt at rest and Spencer degree for right to left shunt with Valsalva.  Lower extremity venous Doppler was negative for DVT.  Hypercoagulable panel is negative.  The patient has been started on aspirin and clopidogrel.  He denies any history of chest pain, chest pressure, shortness of breath, edema, orthopnea, or PND.  He has had rare palpitations in the past.  Past Medical History:  Diagnosis Date   Anxiety    Effexor   Arthritis osteo both ankles    Asthma    childhood only    Past Surgical History:  Procedure Laterality Date   APPENDECTOMY Right 2003   club feet Bilateral    4 surgeries as a child    Current Medications: Current Meds  Medication Sig   aspirin 81 MG EC tablet Take 1 tablet (81 mg total) by mouth daily. Swallow whole.   clopidogrel (PLAVIX) 75 MG tablet Take 1 tablet (75 mg total) by mouth daily.   multivitamin (ONE-A-DAY MEN'S) TABS tablet Take 1 tablet by mouth 3 (three) times a week.   rosuvastatin (CRESTOR) 20 MG tablet Take 1 tablet (20 mg total) by mouth daily.    venlafaxine XR (EFFEXOR XR) 37.5 MG 24 hr capsule Take 1 capsule (37.5 mg total) by mouth daily with breakfast.   VITAMIN D3 SUPER STRENGTH 50 MCG (2000 UT) CAPS Take 2,000 Units by mouth 3 (three) times a week.     Allergies:   Patient has no known allergies.   Social History   Socioeconomic History   Marital status: Married    Spouse name: Not on file   Number of children: 2   Years of education: Not on file   Highest education level: Not on file  Occupational History   Not on file  Tobacco Use   Smoking status: Never   Smokeless tobacco: Never  Vaping Use   Vaping Use: Never used  Substance and Sexual Activity   Alcohol use: Yes    Comment: social   Drug use: No   Sexual activity: Not on file  Other Topics Concern   Not on file  Social History Narrative   Married, 2 kids   Exercises regularly   Social Determinants of Health   Financial Resource Strain: Not on file  Food Insecurity: Not on file  Transportation Needs: Not on file  Physical Activity: Not on file  Stress: Not on file  Social Connections: Not on file     Family History:  The patient's family history includes ADD / ADHD in his brother; Alcohol abuse in his father; Asthma in his mother; COPD in his mother; Dementia in his maternal grandmother and paternal grandmother; Diabetes in his father; Heart disease in his paternal grandfather; Hyperlipidemia in his father; Hypertension in his father and mother; Lymphoma in his maternal grandmother; Mental illness in his maternal aunt; Parkinson's disease in his maternal grandfather; Polycystic ovary syndrome in his sister.  ROS:   Please see the history of present illness.    All other systems reviewed and are negative.  EKGs/Labs/Other Studies Reviewed:    The following studies were reviewed today: TEE: FORMAL ECHOCARDIOGRAM REPORT PENDING Normal biventricular size and function. PFO with left to right shunt by color flow Doppler. Both TTE bubble study and  TEE bubble study positive for right to left shunt with small amount of bubbles. TTE positive with Valsalva, TEE positive at rest.  No ASD.  Apparent normal variant pulmonary vein anatomy with 3 on the right, 2 on the left.  No valvular heart disease. Normal appearing coronary artery origins.   TCD Bubble: Summary:      A vascular evaluation was performed. The right middle cerebral artery was  studied. An IV was inserted into the patient's right forearm. Verbal  informed consent was obtained.     Between 10 and 15 high intensity transient signals (HITS) were observed at  rest, indicating a Spencer Grade 2 patent foramen ovale (PFO). A partial  curtain of high intensity transient signals (HITS) were observed with  valsalva, indicating a Spencer Grade   4 patent foramen ovale (PFO) with valsalva maneuver.   Positive TCD Bubble study indicative of a moderate size ( spencer grade 4  0 right to left shunt.  *See table(s) above for TCD measurements and observations.   Brain MRI: IMPRESSION: 1. 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 2. Patent intracranial vasculature. No high-grade stenosis or occlusion.  EKG:  EKG is not ordered today.  The EKG from 04/30/2021 shows normal sinus rhythm with early repolarization pattern, otherwise within normal limits.  Recent Labs: 04/30/2021: ALT 26; BUN 17; Creatinine, Ser 1.00; Hemoglobin 18.4; Platelets 386; Potassium 4.6; Sodium 140 05/01/2021: TSH 4.203  Recent Lipid Panel    Component Value Date/Time   CHOL 235 (H) 05/01/2021 0155   TRIG 90 05/01/2021 0155   HDL 59 05/01/2021 0155   CHOLHDL 4.0 05/01/2021 0155   VLDL 18 05/01/2021 0155   LDLCALC 158 (H) 05/01/2021 0155   LDLCALC 112 (H) 04/12/2019 0646     Risk Assessment/Calculations:     ROPE Score = 9  Physical Exam:    VS:  BP 130/90   Pulse 70   Ht 6' (1.829 m)   Wt 176 lb 9.6 oz (80.1 kg)   SpO2 97%   BMI 23.95 kg/m     Wt Readings  from Last 3 Encounters:  05/11/21 176 lb 9.6 oz (80.1 kg)  05/05/21 172 lb (78 kg)  11/30/19 170 lb (77.1 kg)     GEN:  Well nourished, well developed physically fit male in no acute distress HEENT: Normal NECK: No JVD; No carotid bruits LYMPHATICS: No lymphadenopathy CARDIAC: RRR, no murmurs, rubs, gallops RESPIRATORY:  Clear to auscultation without rales, wheezing or rhonchi  ABDOMEN: Soft, non-tender, non-distended MUSCULOSKELETAL:  No edema; No deformity  SKIN: Warm and dry NEUROLOGIC:  Alert and oriented x 3 PSYCHIATRIC:  Normal affect   ASSESSMENT:    1. PFO (  patent foramen ovale)    PLAN:    In order of problems listed above:  Physically fit 40 year old male with recent cryptogenic stroke found to have PFO.  I personally reviewed the patient's transesophageal echo study.  This demonstrates a moderate PFO with baseline left to right flow through the PFO defect demonstrated on Doppler imaging.  The patient's agitated saline study is positive for right-to-left shunting as well.  There are no other significant cardiac abnormalities identified.  Stroke evaluation demonstrated no evidence of atherosclerotic disease.  Coagulation studies are normal with no evidence of anticardiolipin antibodies, lupus anticoagulant, or other heritable disorders.  I have reviewed the association between PFO and cryptogenic stroke with the patient today.  We discussed available evidence regarding transcatheter PFO closure versus ongoing medical therapy.  Risks, indications, and alternatives of transcatheter PFO closure discussed with the patient today.  He understands that serious risks include bleeding, vascular injury, stroke, myocardial infarction, arrhythmia, cardiac injury, cardiac tamponade, need for emergency pericardiocentesis, need for emergency cardiac surgery, device infection, device erosion, and death.  He understands the risk of serious complication is low, estimated at approximately 1% or less.   The patient provides full informed consent.  Medication Adjustments/Labs and Tests Ordered: Current medicines are reviewed at length with the patient today.  Concerns regarding medicines are outlined above.  No orders of the defined types were placed in this encounter.  No orders of the defined types were placed in this encounter.   Patient Instructions  CLOSURE INSTRUCTIONS (05/13/2021): You are scheduled for a PFO/ASD CLOSURE on Wednesday, November 2 with Dr. Sherren Mocha.  1. Please arrive at the [facility] (Main Entrance A) at [facility]: [address] [place], [place] [id number] at 10:30 AM (This time is two hours before your procedure to ensure your preparation). Free valet parking service is available. You are allowed ONE visitor in the waiting room during your procedure. Both you and your guest must wear masks. Special note: Every effort is made to have your procedure done on time. Please understand that emergencies sometimes delay scheduled procedures.  2. Diet: Make sure to stay well hydrated the day before your procedure! Do not eat solid foods after midnight.  You may have clear liquids until 5am upon the day of the procedure.  3. Medication instructions in preparation for your procedure:  1) MAKE SURE TO TAKE YOUR ASPIRIN AND PLAVIX the morning of your closure  2) Other meds may be taken as directed with a sip of water.  4. Plan for one night stay--bring personal belongings (this is a disclaimer, not an intention. We want you to go home!). 5. Bring a current list of your medications and current insurance cards. 6. You MUST have a responsible person to drive you home. 7. Someone MUST be with you the first 24 hours after you arrive home or your discharge will be delayed. 8. Please wear clothes that are easy to get on and off and wear slip-on shoes.   FOLLOW-UP APPOINTMENT (06/22/2021): You are scheduled for your 1 month follow-up on Monday, December 12 at 12:00PM  with Kathyrn Drown, NP.. [address]   Signed, Sherren Mocha, MD  05/11/2021 2:14 PM    [facility]

## 2021-05-13 ENCOUNTER — Encounter (HOSPITAL_COMMUNITY)
Admission: RE | Disposition: A | Discharge status: Home / Self Care | Payer: Self-pay | Source: Home / Self Care | Attending: Cardiovascular Disease

## 2021-05-13 ENCOUNTER — Ambulatory Visit (HOSPITAL_COMMUNITY)
Admission: RE | Admit: 2021-05-13 | Discharge: 2021-05-13 | Disposition: A | Discharge status: Home / Self Care | Payer: 59 | Attending: Cardiovascular Disease | Admitting: Cardiovascular Disease

## 2021-05-13 ENCOUNTER — Other Ambulatory Visit: Payer: Self-pay

## 2021-05-13 ENCOUNTER — Ambulatory Visit (HOSPITAL_BASED_OUTPATIENT_CLINIC_OR_DEPARTMENT_OTHER): Payer: 59

## 2021-05-13 DIAGNOSIS — Z7982 Long term (current) use of aspirin: Secondary | ICD-10-CM | POA: Insufficient documentation

## 2021-05-13 DIAGNOSIS — Z8249 Family history of ischemic heart disease and other diseases of the circulatory system: Secondary | ICD-10-CM | POA: Diagnosis not present

## 2021-05-13 DIAGNOSIS — I639 Cerebral infarction, unspecified: Secondary | ICD-10-CM | POA: Diagnosis not present

## 2021-05-13 DIAGNOSIS — Z79899 Other long term (current) drug therapy: Secondary | ICD-10-CM | POA: Insufficient documentation

## 2021-05-13 DIAGNOSIS — Q2112 Patent foramen ovale: Secondary | ICD-10-CM | POA: Diagnosis not present

## 2021-05-13 DIAGNOSIS — Z7902 Long term (current) use of antithrombotics/antiplatelets: Secondary | ICD-10-CM | POA: Diagnosis not present

## 2021-05-13 HISTORY — PX: PATENT FORAMEN OVALE(PFO) CLOSURE: CATH118300

## 2021-05-13 LAB — POCT ACTIVATED CLOTTING TIME: Activated Clotting Time: 231 seconds

## 2021-05-13 LAB — ECHOCARDIOGRAM LIMITED
Height: 72 in
Weight: 2800 oz

## 2021-05-13 SURGERY — PATENT FORAMEN OVALE (PFO) CLOSURE
Anesthesia: LOCAL

## 2021-05-13 MED ORDER — HEPARIN (PORCINE) IN NACL 1000-0.9 UT/500ML-% IV SOLN
INTRAVENOUS | Status: DC | PRN
  Administered 2021-05-13 (×2): 500 mL

## 2021-05-13 MED ORDER — HEPARIN (PORCINE) IN NACL 1000-0.9 UT/500ML-% IV SOLN
INTRAVENOUS | Status: AC
  Filled 2021-05-13: qty 500

## 2021-05-13 MED ORDER — CLOPIDOGREL BISULFATE 75 MG PO TABS: 75.0000 mg | ORAL_TABLET | ORAL | Status: DC

## 2021-05-13 MED ORDER — ONDANSETRON HCL 4 MG/2ML IJ SOLN: 4.0000 mg | Freq: Four times a day (QID) | INTRAMUSCULAR | Status: DC | PRN

## 2021-05-13 MED ORDER — SODIUM CHLORIDE 0.9% FLUSH: 3.0000 mL | Freq: Two times a day (BID) | INTRAVENOUS | Status: DC

## 2021-05-13 MED ORDER — CEFAZOLIN SODIUM-DEXTROSE 2-4 GM/100ML-% IV SOLN
2.0000 g | INTRAVENOUS | Status: AC
  Administered 2021-05-13: 2 g via INTRAVENOUS
  Filled 2021-05-13: qty 100

## 2021-05-13 MED ORDER — FENTANYL CITRATE (PF) 100 MCG/2ML IJ SOLN
INTRAMUSCULAR | Status: AC
  Filled 2021-05-13: qty 2

## 2021-05-13 MED ORDER — SODIUM CHLORIDE 0.9% FLUSH: 3.0000 mL | INTRAVENOUS | Status: DC | PRN

## 2021-05-13 MED ORDER — FENTANYL CITRATE (PF) 100 MCG/2ML IJ SOLN
INTRAMUSCULAR | Status: DC | PRN
  Administered 2021-05-13: 25 ug via INTRAVENOUS
  Administered 2021-05-13: 50 ug via INTRAVENOUS

## 2021-05-13 MED ORDER — LIDOCAINE HCL (PF) 1 % IJ SOLN
INTRAMUSCULAR | Status: AC
  Filled 2021-05-13: qty 30

## 2021-05-13 MED ORDER — HEPARIN SODIUM (PORCINE) 1000 UNIT/ML IJ SOLN
INTRAMUSCULAR | Status: DC | PRN
  Administered 2021-05-13: 6000 [IU] via INTRAVENOUS

## 2021-05-13 MED ORDER — HYDRALAZINE HCL 20 MG/ML IJ SOLN: 10.0000 mg | INTRAMUSCULAR | Status: DC | PRN

## 2021-05-13 MED ORDER — MIDAZOLAM HCL 2 MG/2ML IJ SOLN
INTRAMUSCULAR | Status: AC
  Filled 2021-05-13: qty 2

## 2021-05-13 MED ORDER — HEPARIN SODIUM (PORCINE) 1000 UNIT/ML IJ SOLN
INTRAMUSCULAR | Status: AC
  Filled 2021-05-13: qty 1

## 2021-05-13 MED ORDER — LIDOCAINE HCL (PF) 1 % IJ SOLN
INTRAMUSCULAR | Status: DC | PRN
  Administered 2021-05-13: 15 mL

## 2021-05-13 MED ORDER — MIDAZOLAM HCL 2 MG/2ML IJ SOLN
INTRAMUSCULAR | Status: DC | PRN
  Administered 2021-05-13: 1 mg via INTRAVENOUS
  Administered 2021-05-13: 2 mg via INTRAVENOUS

## 2021-05-13 MED ORDER — ASPIRIN 81 MG PO CHEW: 81.0000 mg | CHEWABLE_TABLET | ORAL | Status: DC

## 2021-05-13 MED ORDER — SODIUM CHLORIDE 0.9 % IV SOLN: 250.0000 mL | INTRAVENOUS | Status: DC | PRN

## 2021-05-13 MED ORDER — SODIUM CHLORIDE 0.9 % WEIGHT BASED INFUSION
3.0000 mL/kg/h | INTRAVENOUS | Status: AC
  Administered 2021-05-13: 3 mL/kg/h via INTRAVENOUS

## 2021-05-13 MED ORDER — ACETAMINOPHEN 325 MG PO TABS: 650.0000 mg | ORAL_TABLET | ORAL | Status: DC | PRN

## 2021-05-13 MED ORDER — SODIUM CHLORIDE 0.9 % WEIGHT BASED INFUSION: 1.0000 mL/kg/h | INTRAVENOUS | Status: DC

## 2021-05-13 MED ORDER — LIDOCAINE-EPINEPHRINE 1 %-1:100000 IJ SOLN
INTRAMUSCULAR | Status: AC
  Filled 2021-05-13: qty 1

## 2021-05-13 MED ORDER — LABETALOL HCL 5 MG/ML IV SOLN: 10.0000 mg | INTRAVENOUS | Status: DC | PRN

## 2021-05-13 SURGICAL SUPPLY — 16 items
CATH ACUNAV 8FR 90CM (CATHETERS) ×1 IMPLANT
CATH EXPO 5F MPA-1 (CATHETERS) ×1 IMPLANT
CLOSURE PERCLOSE PROSTYLE (VASCULAR PRODUCTS) ×2 IMPLANT
COVER SWIFTLINK CONNECTOR (BAG) ×1 IMPLANT
GUIDEWIRE AMPLATZER 1.5JX260 (WIRE) ×1 IMPLANT
OCCLUDER PFO TALISMAN 25-18 (Prosthesis & Implant Heart) IMPLANT
PACK CARDIAC CATHETERIZATION (CUSTOM PROCEDURE TRAY) ×2 IMPLANT
PROTECTION STATION PRESSURIZED (MISCELLANEOUS) ×2
SHEATH DELIVERY TALISMAN 8F 80 (SHEATH) IMPLANT
SHEATH INTROD W/O MIN 9FR 25CM (SHEATH) ×1 IMPLANT
SHEATH PINNACLE 8F 10CM (SHEATH) ×1 IMPLANT
SHEATH PROBE COVER 6X72 (BAG) ×1 IMPLANT
STATION PROTECTION PRESSURIZED (MISCELLANEOUS) IMPLANT
TALISMAN DELIVERY SHEATH 8F 80 (SHEATH) ×2
TALISMAN PFO OCCLUDER 25-18 (Prosthesis & Implant Heart) ×2 IMPLANT
WIRE EMERALD 3MM-J .035X150CM (WIRE) ×1 IMPLANT

## 2021-05-13 NOTE — Discharge Instructions (Signed)

## 2021-05-13 NOTE — Progress Notes (Signed)
At 1345, pt's R groin site noted to be oozing. Manual pressure held x10 minutes and pressure dressing applied. Pt placed in supine position.   At 1445, pt's R groin site noted to be significantly oozing again. Manual pressure held x15 minutes and Dr. Excell Seltzer notified. Sherrill at bedside and 2cc of lido/epi injected at the R groin site, per orders from Dr. Excell Seltzer. Sherrill held manual pressure x10 minutes. Pt remains in the supine position. Will continue to monitor pt's groin site and will advance to a sitting position/ambulation as tolerated at 1630 prior to DC.

## 2021-05-13 NOTE — Progress Notes (Signed)
Pt ambulated without difficulty or bleeding s/p epi and lido at R groin site.   Discharged home with his wife who will drive and stay with pt x 24 hrs.

## 2021-05-13 NOTE — Interval H&P Note (Signed)
History and Physical Interval Note:  05/13/2021 11:51 AM  Steve Dickerson  has presented today for surgery, with the diagnosis of PFO.  The various methods of treatment have been discussed with the patient and family. After consideration of risks, benefits and other options for treatment, the patient has consented to  Procedure(s): PATENT FORAMEN OVALE (PFO) CLOSURE (N/A) as a surgical intervention.  The patient's history has been reviewed, patient examined, no change in status, stable for surgery.  I have reviewed the patient's chart and labs.  Questions were answered to the patient's satisfaction.     Tonny Bollman

## 2021-05-13 NOTE — Progress Notes (Signed)
Echocardiogram 2D Echocardiogram has been performed.  Warren Lacy Katielynn Horan RDCS 05/13/2021, 3:40 PM

## 2021-05-14 ENCOUNTER — Encounter (HOSPITAL_COMMUNITY): Payer: Self-pay | Admitting: Cardiovascular Disease

## 2021-05-22 ENCOUNTER — Other Ambulatory Visit (HOSPITAL_COMMUNITY): Payer: Self-pay

## 2021-05-22 DIAGNOSIS — H5213 Myopia, bilateral: Secondary | ICD-10-CM | POA: Diagnosis not present

## 2021-05-25 ENCOUNTER — Other Ambulatory Visit (HOSPITAL_COMMUNITY): Payer: Self-pay

## 2021-05-25 ENCOUNTER — Other Ambulatory Visit (HOSPITAL_BASED_OUTPATIENT_CLINIC_OR_DEPARTMENT_OTHER): Payer: Self-pay

## 2021-06-05 DIAGNOSIS — Z760 Encounter for issue of repeat prescription: Secondary | ICD-10-CM | POA: Diagnosis not present

## 2021-06-09 ENCOUNTER — Other Ambulatory Visit (HOSPITAL_COMMUNITY): Payer: Self-pay

## 2021-06-17 NOTE — Progress Notes (Signed)
[facility]   MULTIDISCIPLINARY HEART VALVE CLINIC                                     Cardiology Office Note:    Date:  06/22/2021   ID:  Steve Dickerson, DOB 1980/12/21, MRN [id number]  PCP:  Pincus Sanes, MD  [facility] Cardiologist: Dr. Excell Seltzer, MD (PFO)  Referring MD: Pincus Sanes, MD   Chief Complaint  Patient presents with   Follow-up    S/p PFO closure   History of Present Illness:    Steve Dickerson is a 40 y.o. male with a hx of CVA found to have a small PFO and underwent PFO closure with Dr. Excell Seltzer 05/13/21.   Mr. Muscat was referred to Dr. Excell Seltzer after presenting to [facility] 04/29/21 with numbness and tingling in his left hand and forearm along with numbness on the right side of his face with associated right arm weakness found to have a small acute infarct in the left semiovale. Evaluation in the hospital included an MRA that showed no intracranial or extracranial stenoses. Echo showed normal LVEF of 60 to 65%. Transcranial Doppler was positive for Spencer degree to right to left shunt at rest and Spencer degree for right to left shunt with Valsalva. LE doppler with no DVT. Hypercoagulable panel was negative. He was started on ASA and Plavix per neurology.  He ultimately underwent PFO closure on 05/13/21 using a 8mm Amplatzer Talisman PFO occluder under fluoroscopic and intracardiac echo guidance. Post procedure echo showed no atrial level shunt by color flow Doppler. Medication plan with ASA and Plavix for 6 months (11/2021).   Today he presents alone and reports that he has been feeling great since closure. He is tolerating ASA and Plavix without issues. He is asking about duration of DAPT as he has recently read a study which supported an 8 month duration of DAPT post implant. Unsure of the study however will discuss with Dr. Excell Seltzer. SBE with Amoxicillin for 6 months discussed as well. RX sent to preferred pharmacy. Denies new neuro symptoms including dizziness,  change in vision or speech. No chest pain, palpitations, or syncope.   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 Swallow whole.   busPIRone (BUSPAR) 5 MG tablet Take 1 tablet (5 mg total) by mouth 3 (three) times daily.   clopidogrel (PLAVIX) 75 MG tablet Take 1 tablet (75 mg total) by mouth daily.   multivitamin (ONE-A-DAY MEN'S)  TABS tablet Take 1 tablet by mouth 3 (three) times a week.   rosuvastatin (CRESTOR) 20 MG tablet Take 1 tablet (20 mg total) by mouth daily.   venlafaxine XR (EFFEXOR XR) 37.5 MG 24 hr capsule Take 1 capsule (37.5 mg total) by mouth daily with breakfast.   VITAMIN D3 SUPER STRENGTH 50 MCG (2000 UT) CAPS Take 2,000 Units by mouth 3 (three) times a week.     Allergies:   Patient has no known allergies.   Social History   Socioeconomic History   Marital status: Married    Spouse name: Not on file   Number of children: 2   Years of education: Not on file   Highest education level: Not on file  Occupational History   Not on file  Tobacco Use   Smoking status: Never   Smokeless tobacco: Never  Vaping  Use   Vaping Use: Never used  Substance and Sexual Activity   Alcohol use: Yes    Comment: social   Drug use: No   Sexual activity: Not on file  Other Topics Concern   Not on file  Social History Narrative   Married, 2 kids   Exercises regularly   Social Determinants of Health   Financial Resource Strain: Not on file  Food Insecurity: Not on file  Transportation Needs: Not on file  Physical Activity: Not on file  Stress: Not on file  Social Connections: Not on file     Family History: The patient's family history includes ADD / ADHD in his brother; Alcohol abuse in his father; Asthma in his mother; COPD in his mother; Dementia in his maternal grandmother and paternal grandmother; Diabetes in his father; Heart disease in his paternal grandfather; Hyperlipidemia in his father; Hypertension in his father and mother; Lymphoma in his maternal grandmother; Mental illness in his maternal aunt; Parkinson's disease in his maternal grandfather; Polycystic ovary syndrome in his sister.  ROS:   Please see the history of present illness.    All other systems reviewed and are negative.  EKGs/Labs/Other Studies Reviewed:    The following studies were reviewed today:  Limited echo 05/13/21:   1. Left ventricular ejection fraction, by estimation, is 60 to 65%. The  left ventricle has normal function. The left ventricle has no regional  wall motion abnormalities. Left ventricular diastolic function could not  be evaluated.   2. Right ventricular systolic function is normal. The right ventricular  size is normal.   3. The mitral valve is grossly normal.   4. The aortic valve is normal in structure.   5. The inferior vena cava is normal in size with greater than 50%  respiratory variability, suggesting right atrial pressure of 3 mmHg.   PFO closure 05/13/21:  Successful transcatheter PFO closure using a 25 mm Amplatzer Talisman PFO occluder under fluoroscopic and intracardiac echo  guidance   Recommend: Same day DC protocol Limited 2D echo prior to DC ASA/clopidogrel x 6 months   EKG:  EKG is not ordered today.    Recent Labs: 04/30/2021: ALT 26; BUN 17; Creatinine, Ser 1.00; Hemoglobin 18.4; Platelets 386; Potassium 4.6; Sodium 140 05/01/2021: TSH 4.203  Recent Lipid Panel    Component Value Date/Time   CHOL 235 (H) 05/01/2021 0155   TRIG 90 05/01/2021 0155   HDL 59 05/01/2021 0155   CHOLHDL 4.0 05/01/2021 0155   VLDL 18 05/01/2021 0155   LDLCALC 158 (H) 05/01/2021 0155   LDLCALC 112 (H) 04/12/2019 0263  Physical Exam:    VS:  Ht 6' (1.829 m)   Wt 183 lb (83 kg)   BMI 24.82 kg/m     Wt Readings from Last 3 Encounters:  06/22/21 183 lb (83 kg)  05/13/21 175 lb (79.4 kg)  05/11/21 176 lb 9.6 oz (80.1 kg)    General: Well developed, well nourished, NAD Lungs:Clear to ausculation bilaterally. No wheezes, rales, or rhonchi. Breathing is unlabored. Cardiovascular: RRR with S1 S2. No murmurs Extremities: No edema.  Neuro: Alert and oriented. No focal deficits. No facial asymmetry. MAE spontaneously. Psych: Responds to questions appropriately with normal affect.    ASSESSMENT/PLAN:    PFO with CVA: Presented to [facility] with neuro changes found to have a small acute infarct in the left semiovale. Echo showed normal LVEF of 60 to 65%. Transcranial Doppler was positive for Spencer degree to right to left shunt at rest and Spencer degree for right to left shunt with Valsalva. Ultimately underwent PFO closure on 05/13/21 using a 54mm Amplatzer Talisman PFO occluder under fluoroscopic and intracardiac echo guidance. Post procedure echo showed no atrial level shunt by color flow Doppler. Medication plan with ASA and Plavix for 6 months (11/2021). Patient doing well however asking about duration of DAPT as he has recently found information supporting longer antiplatelet therapy use (8 months) after PFO closure. He plans to send me this information. SBE discussed and  prescribed. Plan for one year follow up with limited echo with bubble.   Medication Adjustments/Labs and Tests Ordered: Current medicines are reviewed at length with the patient today.  Concerns regarding medicines are outlined above.  No orders of the defined types were placed in this encounter.  No orders of the defined types were placed in this encounter.   There are no Patient Instructions on file for this visit.   Signed, Georgie Chard, NP  06/22/2021 11:49 AM    [facility]

## 2021-06-22 ENCOUNTER — Other Ambulatory Visit: Payer: Self-pay

## 2021-06-22 ENCOUNTER — Encounter: Payer: Self-pay | Admitting: Cardiology

## 2021-06-22 ENCOUNTER — Other Ambulatory Visit (HOSPITAL_COMMUNITY): Payer: Self-pay

## 2021-06-22 ENCOUNTER — Ambulatory Visit (INDEPENDENT_AMBULATORY_CARE_PROVIDER_SITE_OTHER): Payer: 59 | Admitting: Cardiology

## 2021-06-22 VITALS — BP 132/80 | HR 75 | Ht 72.0 in | Wt 183.0 lb

## 2021-06-22 DIAGNOSIS — I639 Cerebral infarction, unspecified: Secondary | ICD-10-CM | POA: Diagnosis not present

## 2021-06-22 DIAGNOSIS — Q2112 Patent foramen ovale: Secondary | ICD-10-CM | POA: Diagnosis not present

## 2021-06-22 MED ORDER — AMOXICILLIN 500 MG PO CAPS
2000.0000 mg | ORAL_CAPSULE | ORAL | 0 refills | Status: DC
  Filled 2021-06-22: qty 8, 2d supply, fill #0

## 2021-06-22 MED ORDER — CLOPIDOGREL BISULFATE 75 MG PO TABS
75.0000 mg | ORAL_TABLET | Freq: Every day | ORAL | 2 refills | Status: DC
  Filled 2021-06-22 – 2021-07-19 (×2): qty 90, 90d supply, fill #0
  Filled 2021-10-26: qty 90, 90d supply, fill #1

## 2021-06-22 NOTE — Patient Instructions (Addendum)
Medication Instructions:  START AMOXICILLIN 500 MG: TAKE 2000 MG (4 TABLETS) 1 HOUR BEFORE DENTAL PROCEDURES *If you need a refill on your cardiac medications before your next appointment, please call your pharmacy*   Lab Work: NONE If you have labs (blood work) drawn today and your tests are completely normal, you will receive your results only by: MyChart Message (if you have MyChart) OR A paper copy in the mail If you have any lab test that is abnormal or we need to change your treatment, we will call you to review the results.   Testing/Procedures: Your physician has requested that you have an LIMITED BUBBLE echocardiogram. Echocardiography is a painless test that uses sound waves to create images of your heart. It provides your doctor with information about the size and shape of your heart and how well your heart's chambers and valves are working. This procedure takes approximately one hour. There are no restrictions for this procedure.    Follow-Up: At [facility], you and your health needs are our priority.  As part of our continuing mission to provide you with exceptional heart care, we have created designated Provider Care Teams.  These Care Teams include your primary Cardiologist (physician) and Advanced Practice Providers (APPs -  Physician Assistants and Nurse Practitioners) who all work together to provide you with the care you need, when you need it.  We recommend signing up for the patient portal called "MyChart".  Sign up information is provided on this After Visit Summary.  MyChart is used to connect with patients for Virtual Visits (Telemedicine).  Patients are able to view lab/test results, encounter notes, upcoming appointments, etc.  Non-urgent messages can be sent to your provider as well.   To learn more about what you can do with MyChart, go to [email or web address].    Your next appointment:   A TEAM MEMBER FOR OUR STRUCTURAL HEART TEAM WILL CALL YOU TO SCHEDULE  AN APPOINTMENT.

## 2021-06-23 ENCOUNTER — Other Ambulatory Visit: Payer: Self-pay

## 2021-06-23 ENCOUNTER — Other Ambulatory Visit (INDEPENDENT_AMBULATORY_CARE_PROVIDER_SITE_OTHER): Payer: 59

## 2021-06-23 ENCOUNTER — Other Ambulatory Visit: Payer: Self-pay | Admitting: Internal Medicine

## 2021-06-23 ENCOUNTER — Other Ambulatory Visit (HOSPITAL_COMMUNITY): Payer: Self-pay

## 2021-06-23 DIAGNOSIS — M2550 Pain in unspecified joint: Secondary | ICD-10-CM

## 2021-06-23 LAB — COMPREHENSIVE METABOLIC PANEL
ALT: 48 U/L (ref 0–53)
AST: 35 U/L (ref 0–37)
Albumin: 4.3 g/dL (ref 3.5–5.2)
Alkaline Phosphatase: 74 U/L (ref 39–117)
BUN: 12 mg/dL (ref 6–23)
CO2: 30 mEq/L (ref 19–32)
Calcium: 9.5 mg/dL (ref 8.4–10.5)
Chloride: 100 mEq/L (ref 96–112)
Creatinine, Ser: 0.91 mg/dL (ref 0.40–1.50)
GFR: 106.29 mL/min (ref >60.00)
Glucose, Bld: 86 mg/dL (ref 70–99)
Potassium: 4.3 mEq/L (ref 3.5–5.1)
Sodium: 138 mEq/L (ref 135–145)
Total Bilirubin: 0.5 mg/dL (ref 0.2–1.2)
Total Protein: 6.8 g/dL (ref 6.0–8.3)

## 2021-06-23 LAB — CBC WITH DIFFERENTIAL/PLATELET
Basophils Absolute: 0 10*3/uL (ref 0.0–0.1)
Basophils Relative: 0.6 % (ref 0.0–3.0)
Eosinophils Absolute: 0.2 10*3/uL (ref 0.0–0.7)
Eosinophils Relative: 2.8 % (ref 0.0–5.0)
HCT: 47.6 % (ref 39.0–52.0)
Hemoglobin: 15.7 g/dL (ref 13.0–17.0)
Lymphocytes Relative: 33.6 % (ref 12.0–46.0)
Lymphs Abs: 1.9 10*3/uL (ref 0.7–4.0)
MCHC: 33 g/dL (ref 30.0–36.0)
MCV: 89.8 fl (ref 78.0–100.0)
Monocytes Absolute: 0.6 10*3/uL (ref 0.1–1.0)
Monocytes Relative: 9.7 % (ref 3.0–12.0)
Neutro Abs: 3 10*3/uL (ref 1.4–7.7)
Neutrophils Relative %: 53.3 % (ref 43.0–77.0)
Platelets: 328 10*3/uL (ref 150.0–400.0)
RBC: 5.31 Mil/uL (ref 4.22–5.81)
RDW: 13.9 % (ref 11.5–15.5)
WBC: 5.7 10*3/uL (ref 4.0–10.5)

## 2021-06-23 LAB — TESTOSTERONE: Testosterone: 486.36 ng/dL (ref 300.00–890.00)

## 2021-06-23 LAB — TSH: TSH: 3.11 u[IU]/mL (ref 0.35–5.50)

## 2021-06-23 LAB — VITAMIN D 25 HYDROXY (VIT D DEFICIENCY, FRACTURES): VITD: 23.46 ng/mL — ABNORMAL LOW (ref 30.00–100.00)

## 2021-06-23 LAB — LIPID PANEL
Cholesterol: 162 mg/dL (ref 0–200)
HDL: 64.5 mg/dL (ref >39.00)
LDL Cholesterol: 70 mg/dL (ref 0–99)
NonHDL: 97.58
Total CHOL/HDL Ratio: 3
Triglycerides: 139 mg/dL (ref 0.0–149.0)
VLDL: 27.8 mg/dL (ref 0.0–40.0)

## 2021-06-23 MED ORDER — VITAMIN D (ERGOCALCIFEROL) 1.25 MG (50000 UNIT) PO CAPS
50000.0000 [IU] | ORAL_CAPSULE | ORAL | 0 refills | Status: DC
  Filled 2021-06-23: qty 8, 56d supply, fill #0

## 2021-07-20 ENCOUNTER — Other Ambulatory Visit (HOSPITAL_COMMUNITY): Payer: Self-pay

## 2021-07-21 ENCOUNTER — Encounter: Payer: Self-pay | Admitting: Internal Medicine

## 2021-07-21 ENCOUNTER — Other Ambulatory Visit: Payer: Self-pay

## 2021-07-21 ENCOUNTER — Other Ambulatory Visit (HOSPITAL_COMMUNITY): Payer: Self-pay

## 2021-07-21 MED ORDER — VENLAFAXINE HCL ER 37.5 MG PO CP24
37.5000 mg | ORAL_CAPSULE | Freq: Every day | ORAL | 1 refills | Status: DC
  Filled 2021-07-21: qty 90, 90d supply, fill #0
  Filled 2021-10-26: qty 90, 90d supply, fill #1

## 2021-07-21 MED ORDER — VITAMIN D (ERGOCALCIFEROL) 1.25 MG (50000 UNIT) PO CAPS
50000.0000 [IU] | ORAL_CAPSULE | ORAL | 0 refills | Status: DC
  Filled 2021-07-21 – 2022-04-01 (×2): qty 8, 56d supply, fill #0

## 2021-08-22 ENCOUNTER — Other Ambulatory Visit (HOSPITAL_COMMUNITY): Payer: Self-pay

## 2021-09-24 ENCOUNTER — Other Ambulatory Visit (HOSPITAL_COMMUNITY): Payer: Self-pay

## 2021-10-26 ENCOUNTER — Other Ambulatory Visit (HOSPITAL_COMMUNITY): Payer: Self-pay

## 2021-11-06 ENCOUNTER — Ambulatory Visit: Payer: 59 | Admitting: Neurology

## 2021-11-28 ENCOUNTER — Other Ambulatory Visit (HOSPITAL_COMMUNITY): Payer: Self-pay

## 2021-12-20 ENCOUNTER — Encounter: Payer: Self-pay | Admitting: Internal Medicine

## 2021-12-20 NOTE — Patient Instructions (Addendum)
Blood work was ordered.     Medications changes include :   none   Your prescription(s) have been sent to your pharmacy.     Return in about 1 year (around 12/22/2022) for Physical Exam.   Health Maintenance, Male Adopting a healthy lifestyle and getting preventive care are important in promoting health and wellness. Ask your health care provider about: The right schedule for you to have regular tests and exams. Things you can do on your own to prevent diseases and keep yourself healthy. What should I know about diet, weight, and exercise? Eat a healthy diet  Eat a diet that includes plenty of vegetables, fruits, low-fat dairy products, and lean protein. Do not eat a lot of foods that are high in solid fats, added sugars, or sodium. Maintain a healthy weight Body mass index (BMI) is a measurement that can be used to identify possible weight problems. It estimates body fat based on height and weight. Your health care provider can help determine your BMI and help you achieve or maintain a healthy weight. Get regular exercise Get regular exercise. This is one of the most important things you can do for your health. Most adults should: Exercise for at least 150 minutes each week. The exercise should increase your heart rate and make you sweat (moderate-intensity exercise). Do strengthening exercises at least twice a week. This is in addition to the moderate-intensity exercise. Spend less time sitting. Even light physical activity can be beneficial. Watch cholesterol and blood lipids Have your blood tested for lipids and cholesterol at 40 years of age, then have this test every 5 years. You may need to have your cholesterol levels checked more often if: Your lipid or cholesterol levels are high. You are older than 40 years of age. You are at high risk for heart disease. What should I know about cancer screening? Many types of cancers can be detected early and may often be  prevented. Depending on your health history and family history, you may need to have cancer screening at various ages. This may include screening for: Colorectal cancer. Prostate cancer. Skin cancer. Lung cancer. What should I know about heart disease, diabetes, and high blood pressure? Blood pressure and heart disease High blood pressure causes heart disease and increases the risk of stroke. This is more likely to develop in people who have high blood pressure readings or are overweight. Talk with your health care provider about your target blood pressure readings. Have your blood pressure checked: Every 3-5 years if you are 40-66 years of age. Every year if you are 40 years old or older. If you are between the ages of 94 and 40 and are a current or former smoker, ask your health care provider if you should have a one-time screening for abdominal aortic aneurysm (AAA). Diabetes Have regular diabetes screenings. This checks your fasting blood sugar level. Have the screening done: Once every three years after age 22 if you are at a normal weight and have a low risk for diabetes. More often and at a younger age if you are overweight or have a high risk for diabetes. What should I know about preventing infection? Hepatitis B If you have a higher risk for hepatitis B, you should be screened for this virus. Talk with your health care provider to find out if you are at risk for hepatitis B infection. Hepatitis C Blood testing is recommended for: Everyone born from [birth year] through 1964. Anyone with known  risk factors for hepatitis C. Sexually transmitted infections (STIs) You should be screened each year for STIs, including gonorrhea and chlamydia, if: You are sexually active and are younger than 40 years of age. You are older than 40 years of age and your health care provider tells you that you are at risk for this type of infection. Your sexual activity has changed since you were last screened,  and you are at increased risk for chlamydia or gonorrhea. Ask your health care provider if you are at risk. Ask your health care provider about whether you are at high risk for HIV. Your health care provider may recommend a prescription medicine to help prevent HIV infection. If you choose to take medicine to prevent HIV, you should first get tested for HIV. You should then be tested every 3 months for as long as you are taking the medicine. Follow these instructions at home: Alcohol use Do not drink alcohol if your health care provider tells you not to drink. If you drink alcohol: Limit how much you have to 0-2 drinks a day. Know how much alcohol is in your drink. In the [place] one drink equals one 12 oz bottle of beer (355 mL), one 5 oz glass of wine (148 mL), or one 1 oz glass of hard liquor (44 mL). Lifestyle Do not use any products that contain nicotine or tobacco. These products include cigarettes, chewing tobacco, and vaping devices, such as e-cigarettes. If you need help quitting, ask your health care provider. Do not use street drugs. Do not share needles. Ask your health care provider for help if you need support or information about quitting drugs. General instructions Schedule regular health, dental, and eye exams. Stay current with your vaccines. Tell your health care provider if: You often feel depressed. You have ever been abused or do not feel safe at home. Summary Adopting a healthy lifestyle and getting preventive care are important in promoting health and wellness. Follow your health care provider's instructions about healthy diet, exercising, and getting tested or screened for diseases. Follow your health care provider's instructions on monitoring your cholesterol and blood pressure. This information is not intended to replace advice given to you by your health care provider. Make sure you discuss any questions you have with your health care provider. Document Revised:  11/17/2020 Document Reviewed: 11/17/2020 Elsevier Patient Education  [place].

## 2021-12-20 NOTE — Progress Notes (Unsigned)
Subjective:    Patient ID: UTAH Dickerson, male    DOB: 1981/01/01, 41 y.o.   MRN: [id number]     HPI Steve Dickerson is here for a physical exam.   Overall doing well.  Has no concerns.      Medications and allergies reviewed with patient and updated if appropriate.  Current Outpatient Medications on File Prior to Visit  Medication Sig Dispense Refill   amoxicillin (AMOXIL) 500 MG capsule Take 4 capsules (2,000 mg total) by mouth 1 hour before dental procedure as directed. 8 capsule 0   multivitamin (ONE-A-DAY MEN'S) TABS tablet Take 1 tablet by mouth 3 (three) times a week.     Vitamin D, Ergocalciferol, (DRISDOL) 1.25 MG (50000 UNIT) CAPS capsule Take 1 capsule (50,000 Units total) by mouth every 7 (seven) days. 8 capsule 0   VITAMIN D3 SUPER STRENGTH 50 MCG (2000 UT) CAPS Take 2,000 Units by mouth 3 (three) times a week.     No current facility-administered medications on file prior to visit.    Review of Systems  Constitutional:  Negative for chills and fever.  Eyes:  Negative for visual disturbance.  Respiratory:  Negative for cough, shortness of breath and wheezing.   Cardiovascular:  Positive for palpitations. Negative for chest pain and leg swelling.  Gastrointestinal:  Negative for abdominal pain, blood in stool, constipation, diarrhea and nausea.  Genitourinary:  Negative for difficulty urinating, dysuria and hematuria.  Musculoskeletal:  Negative for arthralgias and back pain.  Skin:  Negative for rash.  Neurological:  Negative for light-headedness and headaches.  Psychiatric/Behavioral:  Negative for dysphoric mood. The patient is nervous/anxious.        Objective:   Vitals:   12/21/21 1415  BP: 126/82  Pulse: 78  Temp: 98.5 F (36.9 C)  SpO2: 98%   Filed Weights   12/21/21 1415  Weight: 174 lb (78.9 kg)   Body mass index is 23.6 kg/m.  BP Readings from Last 3 Encounters:  12/21/21 126/82  06/22/21 132/80  05/13/21 136/80    Wt Readings from  Last 3 Encounters:  12/21/21 174 lb (78.9 kg)  06/22/21 183 lb (83 kg)  05/13/21 175 lb (79.4 kg)      Physical Exam Constitutional: He appears well-developed and well-nourished. No distress.  HENT:  Head: Normocephalic and atraumatic.  Right Ear: External ear normal.  Left Ear: External ear normal.  Mouth/Throat: Oropharynx is clear and moist.  Normal ear canals and TM b/l  Eyes: Conjunctivae and EOM are normal.  Neck: Neck supple. No tracheal deviation present. No thyromegaly present.  No carotid bruit  Cardiovascular: Normal rate, regular rhythm, normal heart sounds and intact distal pulses.   No murmur heard. Pulmonary/Chest: Effort normal and breath sounds normal. No respiratory distress. He has no wheezes. He has no rales.  Abdominal: Soft. He exhibits no distension. There is no tenderness.  Genitourinary: deferred  Musculoskeletal: He exhibits no edema.  Lymphadenopathy:   He has no cervical adenopathy.  Skin: Skin is warm and dry. He is not diaphoretic.  Psychiatric: He has a normal mood and affect. His behavior is normal.         Assessment & Plan:   Physical exam: Screening blood work  ordered Exercise   regular  Weight  normal Substance abuse   none   Reviewed recommended immunizations.   Health Maintenance  Topic Date Due   Hepatitis C Screening  Never done   TETANUS/TDAP  Never done   INFLUENZA  VACCINE  02/09/2022   HIV Screening  Completed   HPV VACCINES  Aged Out     See Problem List for Assessment and Plan of chronic medical problems.

## 2021-12-21 ENCOUNTER — Ambulatory Visit (INDEPENDENT_AMBULATORY_CARE_PROVIDER_SITE_OTHER): Payer: 59 | Admitting: Internal Medicine

## 2021-12-21 ENCOUNTER — Other Ambulatory Visit (HOSPITAL_COMMUNITY): Payer: Self-pay

## 2021-12-21 VITALS — BP 126/82 | HR 78 | Temp 98.5°F | Ht 72.0 in | Wt 174.0 lb

## 2021-12-21 DIAGNOSIS — I639 Cerebral infarction, unspecified: Secondary | ICD-10-CM | POA: Diagnosis not present

## 2021-12-21 DIAGNOSIS — E559 Vitamin D deficiency, unspecified: Secondary | ICD-10-CM | POA: Diagnosis not present

## 2021-12-21 DIAGNOSIS — Z0000 Encounter for general adult medical examination without abnormal findings: Secondary | ICD-10-CM

## 2021-12-21 DIAGNOSIS — Z1159 Encounter for screening for other viral diseases: Secondary | ICD-10-CM

## 2021-12-21 MED ORDER — VENLAFAXINE HCL ER 37.5 MG PO CP24
37.5000 mg | ORAL_CAPSULE | Freq: Every day | ORAL | 3 refills | Status: DC
  Filled 2021-12-21 – 2022-01-26 (×2): qty 90, 90d supply, fill #0
  Filled 2022-04-01 – 2022-04-22 (×2): qty 90, 90d supply, fill #1
  Filled 2022-07-22 – 2022-07-27 (×2): qty 90, 90d supply, fill #2
  Filled 2022-09-23 – 2022-11-08 (×2): qty 90, 90d supply, fill #3

## 2021-12-21 MED ORDER — ASPIRIN 81 MG PO TBEC
81.0000 mg | DELAYED_RELEASE_TABLET | Freq: Every day | ORAL | 3 refills | Status: DC
  Filled 2021-12-21: qty 90, 90d supply, fill #0
  Filled 2022-04-01: qty 90, 90d supply, fill #1
  Filled 2022-06-29: qty 90, 90d supply, fill #2
  Filled 2022-09-23: qty 90, 90d supply, fill #3

## 2021-12-21 MED ORDER — ROSUVASTATIN CALCIUM 20 MG PO TABS
20.0000 mg | ORAL_TABLET | Freq: Every day | ORAL | 3 refills | Status: DC
Start: 2021-12-21 — End: 2022-12-22
  Filled 2021-12-21: qty 90, 90d supply, fill #0
  Filled 2022-04-01: qty 90, 90d supply, fill #1
  Filled 2022-06-29: qty 90, 90d supply, fill #2
  Filled 2022-09-23: qty 90, 90d supply, fill #3

## 2021-12-21 NOTE — Assessment & Plan Note (Addendum)
H/o CVA due to PFO ( s/p closure) Right arm fatigues quicker, occ tingling in right arm or face when fatigued On ASA 81 mg daily - no longer on plavix On crestor 20 mg daily - LDL goal is < 70 Cmp, lipids, cbc

## 2021-12-21 NOTE — Assessment & Plan Note (Signed)
Chronic Taking vitamin d daily Check vitamin d level  

## 2021-12-22 ENCOUNTER — Other Ambulatory Visit (HOSPITAL_COMMUNITY): Payer: Self-pay

## 2022-01-26 ENCOUNTER — Other Ambulatory Visit (HOSPITAL_COMMUNITY): Payer: Self-pay

## 2022-01-28 ENCOUNTER — Other Ambulatory Visit (INDEPENDENT_AMBULATORY_CARE_PROVIDER_SITE_OTHER): Payer: 59

## 2022-01-28 DIAGNOSIS — E559 Vitamin D deficiency, unspecified: Secondary | ICD-10-CM

## 2022-01-28 DIAGNOSIS — I639 Cerebral infarction, unspecified: Secondary | ICD-10-CM

## 2022-01-28 DIAGNOSIS — Z1159 Encounter for screening for other viral diseases: Secondary | ICD-10-CM

## 2022-01-28 DIAGNOSIS — Z0000 Encounter for general adult medical examination without abnormal findings: Secondary | ICD-10-CM

## 2022-01-28 LAB — CBC WITH DIFFERENTIAL/PLATELET
Basophils Absolute: 0 10*3/uL (ref 0.0–0.1)
Basophils Relative: 0.6 % (ref 0.0–3.0)
Eosinophils Absolute: 0.1 10*3/uL (ref 0.0–0.7)
Eosinophils Relative: 1.4 % (ref 0.0–5.0)
HCT: 49.1 % (ref 39.0–52.0)
Hemoglobin: 16.1 g/dL (ref 13.0–17.0)
Lymphocytes Relative: 27.1 % (ref 12.0–46.0)
Lymphs Abs: 1.5 10*3/uL (ref 0.7–4.0)
MCHC: 32.8 g/dL (ref 30.0–36.0)
MCV: 90.7 fl (ref 78.0–100.0)
Monocytes Absolute: 0.6 10*3/uL (ref 0.1–1.0)
Monocytes Relative: 10.5 % (ref 3.0–12.0)
Neutro Abs: 3.4 10*3/uL (ref 1.4–7.7)
Neutrophils Relative %: 60.4 % (ref 43.0–77.0)
Platelets: 333 10*3/uL (ref 150.0–400.0)
RBC: 5.41 Mil/uL (ref 4.22–5.81)
RDW: 13.6 % (ref 11.5–15.5)
WBC: 5.6 10*3/uL (ref 4.0–10.5)

## 2022-01-28 LAB — LIPID PANEL
Cholesterol: 157 mg/dL (ref 0–200)
HDL: 58.7 mg/dL (ref >39.00)
LDL Cholesterol: 76 mg/dL (ref 0–99)
NonHDL: 98.28
Total CHOL/HDL Ratio: 3
Triglycerides: 110 mg/dL (ref 0.0–149.0)
VLDL: 22 mg/dL (ref 0.0–40.0)

## 2022-01-28 LAB — COMPREHENSIVE METABOLIC PANEL
ALT: 28 U/L (ref 0–53)
AST: 31 U/L (ref 0–37)
Albumin: 4.6 g/dL (ref 3.5–5.2)
Alkaline Phosphatase: 69 U/L (ref 39–117)
BUN: 15 mg/dL (ref 6–23)
CO2: 33 mEq/L — ABNORMAL HIGH (ref 19–32)
Calcium: 9.7 mg/dL (ref 8.4–10.5)
Chloride: 100 mEq/L (ref 96–112)
Creatinine, Ser: 1 mg/dL (ref 0.40–1.50)
GFR: 94.51 mL/min (ref >60.00)
Glucose, Bld: 72 mg/dL (ref 70–99)
Potassium: 4.6 mEq/L (ref 3.5–5.1)
Sodium: 140 mEq/L (ref 135–145)
Total Bilirubin: 0.5 mg/dL (ref 0.2–1.2)
Total Protein: 7 g/dL (ref 6.0–8.3)

## 2022-01-28 LAB — VITAMIN D 25 HYDROXY (VIT D DEFICIENCY, FRACTURES): VITD: 26.54 ng/mL — ABNORMAL LOW (ref 30.00–100.00)

## 2022-01-28 LAB — TSH: TSH: 3.13 u[IU]/mL (ref 0.35–5.50)

## 2022-01-29 LAB — HEPATITIS C ANTIBODY: Hepatitis C Ab: NONREACTIVE

## 2022-04-01 ENCOUNTER — Other Ambulatory Visit (HOSPITAL_COMMUNITY): Payer: Self-pay

## 2022-04-07 ENCOUNTER — Encounter (HOSPITAL_COMMUNITY): Payer: Self-pay

## 2022-04-22 ENCOUNTER — Other Ambulatory Visit (HOSPITAL_COMMUNITY): Payer: Self-pay

## 2022-05-24 ENCOUNTER — Ambulatory Visit (HOSPITAL_COMMUNITY): Payer: 59 | Attending: Cardiology

## 2022-05-24 DIAGNOSIS — Q2112 Patent foramen ovale: Secondary | ICD-10-CM | POA: Diagnosis not present

## 2022-05-24 LAB — ECHOCARDIOGRAM LIMITED BUBBLE STUDY
Area-P 1/2: 2.91 cm2
S' Lateral: 3.5 cm

## 2022-05-24 MED ORDER — SODIUM CHLORIDE 0.9% FLUSH
10.0000 mL | INTRAVENOUS | Status: DC | PRN
  Administered 2022-05-24: 30 mL via INTRAVENOUS

## 2022-06-29 ENCOUNTER — Other Ambulatory Visit: Payer: Self-pay

## 2022-06-29 ENCOUNTER — Other Ambulatory Visit: Payer: Self-pay | Admitting: Internal Medicine

## 2022-06-29 MED ORDER — VITAMIN D (ERGOCALCIFEROL) 1.25 MG (50000 UNIT) PO CAPS
50000.0000 [IU] | ORAL_CAPSULE | ORAL | 0 refills | Status: DC
  Filled 2022-06-29: qty 8, 56d supply, fill #0

## 2022-07-22 ENCOUNTER — Encounter (HOSPITAL_COMMUNITY): Payer: Self-pay

## 2022-07-22 ENCOUNTER — Other Ambulatory Visit (HOSPITAL_COMMUNITY): Payer: Self-pay

## 2022-07-27 ENCOUNTER — Other Ambulatory Visit (HOSPITAL_COMMUNITY): Payer: Self-pay

## 2022-07-27 ENCOUNTER — Other Ambulatory Visit: Payer: Self-pay

## 2022-09-23 ENCOUNTER — Other Ambulatory Visit (HOSPITAL_COMMUNITY): Payer: Self-pay

## 2022-09-23 ENCOUNTER — Other Ambulatory Visit: Payer: Self-pay

## 2022-09-23 ENCOUNTER — Other Ambulatory Visit: Payer: Self-pay | Admitting: Internal Medicine

## 2022-09-23 MED ORDER — VITAMIN D (ERGOCALCIFEROL) 1.25 MG (50000 UNIT) PO CAPS
50000.0000 [IU] | ORAL_CAPSULE | ORAL | 0 refills | Status: DC
  Filled 2022-09-23: qty 8, 56d supply, fill #0

## 2022-11-08 ENCOUNTER — Other Ambulatory Visit (HOSPITAL_COMMUNITY): Payer: Self-pay

## 2022-12-22 ENCOUNTER — Other Ambulatory Visit: Payer: Self-pay | Admitting: Internal Medicine

## 2022-12-22 MED ORDER — ROSUVASTATIN CALCIUM 20 MG PO TABS
20.0000 mg | ORAL_TABLET | Freq: Every day | ORAL | 3 refills | Status: DC
  Filled 2022-12-22: qty 90, 90d supply, fill #0
  Filled 2023-03-21: qty 90, 90d supply, fill #1
  Filled 2023-06-21: qty 90, 90d supply, fill #2
  Filled 2023-09-23: qty 90, 90d supply, fill #3

## 2022-12-23 ENCOUNTER — Other Ambulatory Visit (HOSPITAL_COMMUNITY): Payer: Self-pay

## 2023-01-04 ENCOUNTER — Other Ambulatory Visit: Payer: Self-pay | Admitting: Internal Medicine

## 2023-01-04 ENCOUNTER — Other Ambulatory Visit: Payer: Self-pay

## 2023-01-04 ENCOUNTER — Other Ambulatory Visit (HOSPITAL_COMMUNITY): Payer: Self-pay

## 2023-01-04 MED ORDER — ASPIRIN 81 MG PO TBEC
81.0000 mg | DELAYED_RELEASE_TABLET | Freq: Every day | ORAL | 3 refills | Status: AC
Start: 2023-01-04 — End: ?
  Filled 2023-01-04: qty 90, 90d supply, fill #0
  Filled 2023-03-29: qty 90, 90d supply, fill #1
  Filled 2023-06-21: qty 90, 90d supply, fill #2
  Filled 2023-09-23: qty 90, 90d supply, fill #3

## 2023-01-30 ENCOUNTER — Other Ambulatory Visit: Payer: Self-pay | Admitting: Internal Medicine

## 2023-01-31 ENCOUNTER — Other Ambulatory Visit (HOSPITAL_COMMUNITY): Payer: Self-pay

## 2023-01-31 ENCOUNTER — Other Ambulatory Visit: Payer: Self-pay

## 2023-01-31 MED ORDER — VENLAFAXINE HCL ER 37.5 MG PO CP24
37.5000 mg | ORAL_CAPSULE | Freq: Every day | ORAL | 3 refills | Status: DC
  Filled 2023-01-31: qty 90, 90d supply, fill #0
  Filled 2023-05-10: qty 90, 90d supply, fill #1
  Filled 2023-08-15: qty 90, 90d supply, fill #2
  Filled 2023-09-23 – 2023-11-08 (×2): qty 90, 90d supply, fill #3

## 2023-01-31 MED ORDER — VITAMIN D3 SUPER STRENGTH 50 MCG (2000 UT) PO CAPS
2000.0000 [IU] | ORAL_CAPSULE | ORAL | 4 refills | Status: AC
  Filled 2023-01-31: qty 50, 116d supply, fill #0

## 2023-02-01 ENCOUNTER — Other Ambulatory Visit (HOSPITAL_COMMUNITY): Payer: Self-pay

## 2023-02-02 ENCOUNTER — Other Ambulatory Visit: Payer: Self-pay

## 2023-02-03 ENCOUNTER — Other Ambulatory Visit: Payer: Self-pay

## 2023-02-03 ENCOUNTER — Other Ambulatory Visit (HOSPITAL_COMMUNITY): Payer: Self-pay

## 2023-03-21 ENCOUNTER — Other Ambulatory Visit: Payer: Self-pay

## 2023-03-29 ENCOUNTER — Other Ambulatory Visit (HOSPITAL_COMMUNITY): Payer: Self-pay

## 2023-03-31 ENCOUNTER — Other Ambulatory Visit (HOSPITAL_COMMUNITY): Payer: Self-pay

## 2023-03-31 ENCOUNTER — Other Ambulatory Visit: Payer: Self-pay | Admitting: Cardiology

## 2023-04-01 ENCOUNTER — Other Ambulatory Visit (HOSPITAL_COMMUNITY): Payer: Self-pay

## 2023-04-01 ENCOUNTER — Other Ambulatory Visit: Payer: Self-pay | Admitting: Internal Medicine

## 2023-04-01 ENCOUNTER — Other Ambulatory Visit: Payer: Self-pay

## 2023-04-01 MED ORDER — AMOXICILLIN 500 MG PO CAPS
2000.0000 mg | ORAL_CAPSULE | ORAL | 2 refills | Status: AC
  Filled 2023-04-01: qty 8, 2d supply, fill #0
  Filled 2023-11-08: qty 8, 2d supply, fill #1

## 2023-04-01 MED ORDER — AMOXICILLIN 500 MG PO CAPS
2000.0000 mg | ORAL_CAPSULE | Freq: Every day | ORAL | 0 refills | Status: AC
  Filled 2023-04-01: qty 8, 2d supply, fill #0

## 2023-04-04 ENCOUNTER — Other Ambulatory Visit: Payer: Self-pay

## 2023-05-06 ENCOUNTER — Other Ambulatory Visit (HOSPITAL_COMMUNITY): Payer: Self-pay

## 2023-05-06 ENCOUNTER — Other Ambulatory Visit: Payer: Self-pay

## 2023-05-06 MED ORDER — DOXYCYCLINE MONOHYDRATE 100 MG PO TABS
100.0000 mg | ORAL_TABLET | Freq: Two times a day (BID) | ORAL | 0 refills | Status: DC
  Filled 2023-05-06: qty 14, 7d supply, fill #0

## 2023-05-06 MED ORDER — CEFDINIR 300 MG PO CAPS
300.0000 mg | ORAL_CAPSULE | Freq: Two times a day (BID) | ORAL | 0 refills | Status: DC
  Filled 2023-05-06: qty 14, 7d supply, fill #0

## 2023-05-10 ENCOUNTER — Other Ambulatory Visit: Payer: Self-pay

## 2023-05-27 IMAGING — CT CT HEAD CODE STROKE
4 series · 17 of 47 positions shown, 19 images · non-contrast
Comparison: None.

CLINICAL DATA: Code stroke.  Neuro deficit, acute, stroke suspected

EXAM:
CT HEAD WITHOUT CONTRAST
TECHNIQUE: Contiguous axial images were obtained from the base of the skull
through the vertex without intravenous contrast.

[Series 3: head wo · axial · 0.46mm/px · z∈[-107,+13]mm · 7 of 32 slices shown, 9 images]
[im 4/32  brain]
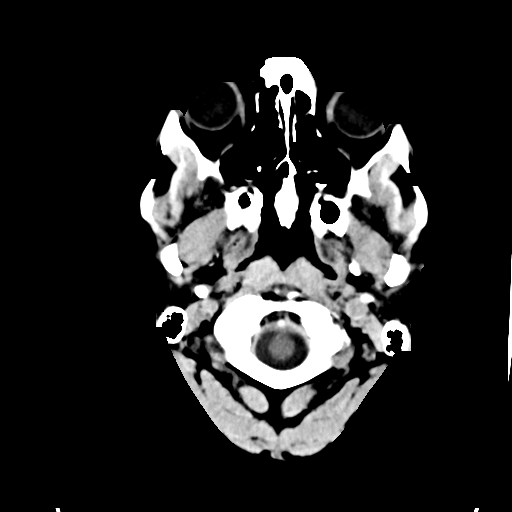
[im 4/32  bone]
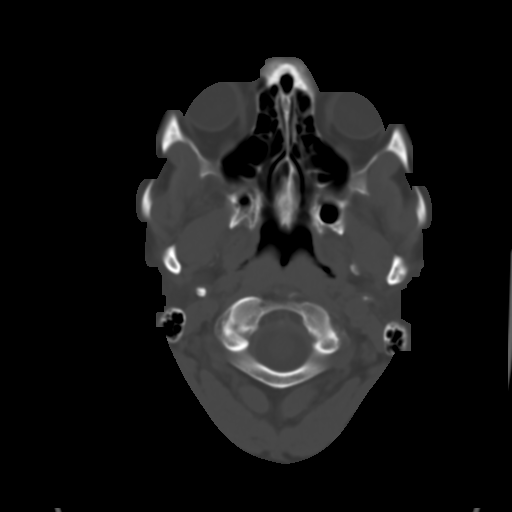
[im 8/32  brain]
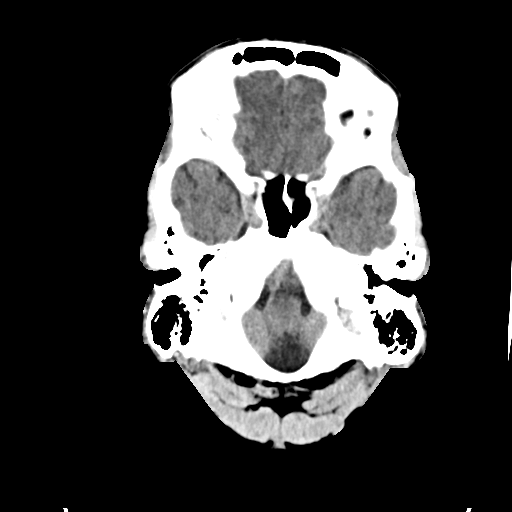
[im 12/32  brain]
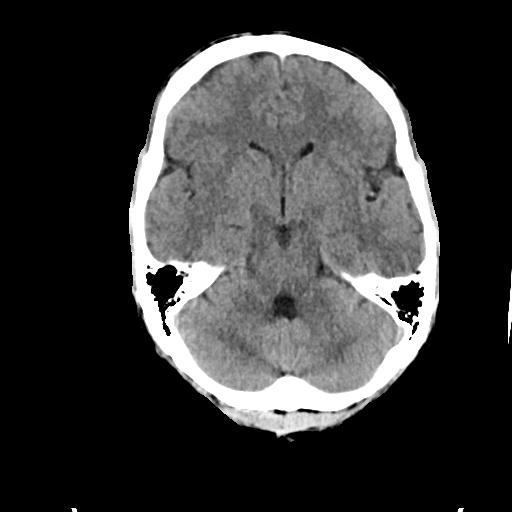
[im 16/32  brain]
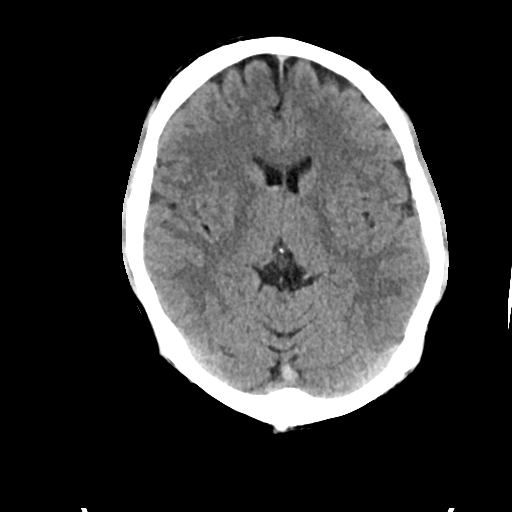
[im 20/32  brain]
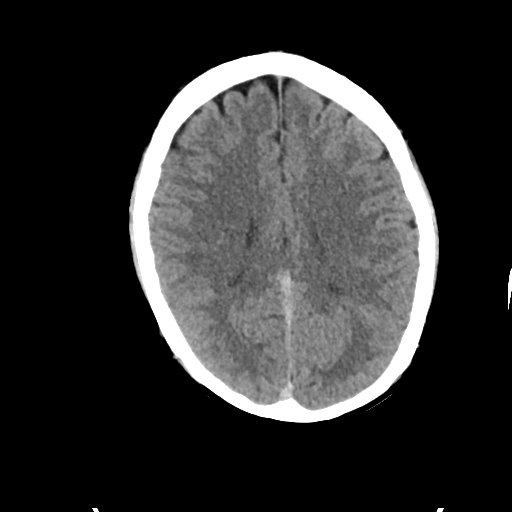
[im 20/32  bone]
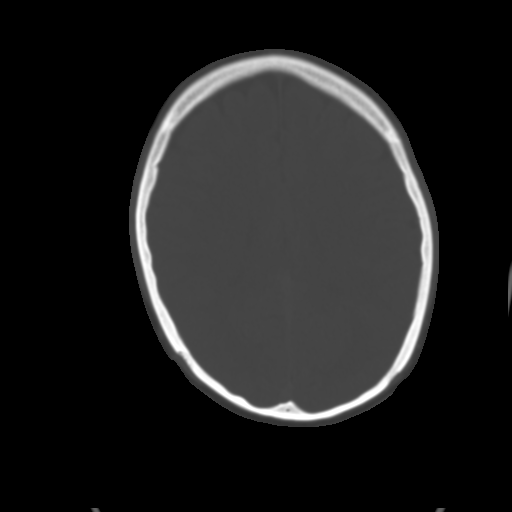
[im 24/32  brain]
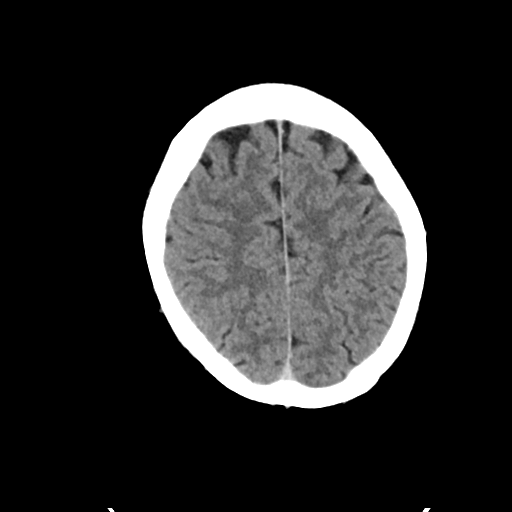
[im 28/32  brain]
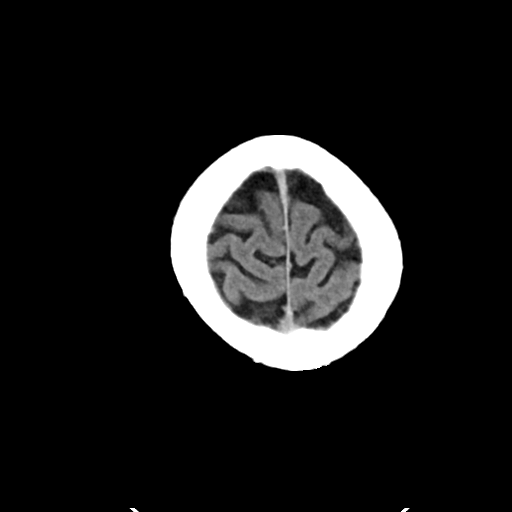

[Series 4: head bone · axial · 0.46mm/px · z∈[-108,-52]mm · 4 of 80 slices shown]
[im 8/80  bone]
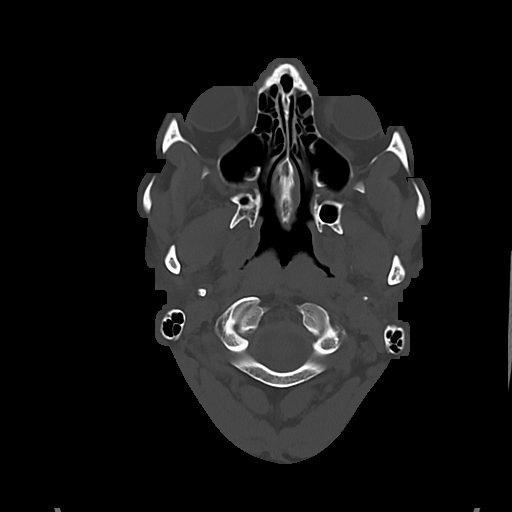
[im 16/80  bone]
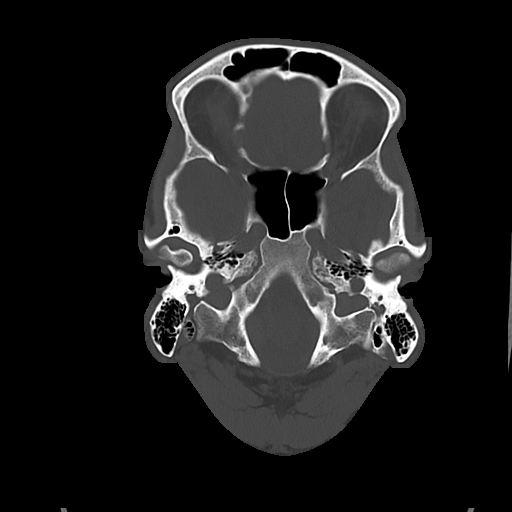
[im 24/80  bone]
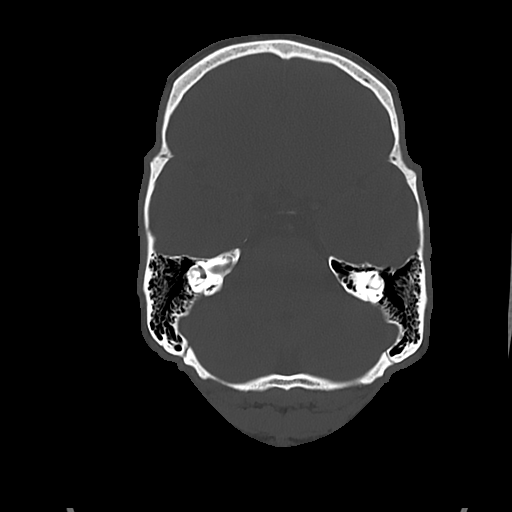
[im 36/80  bone]
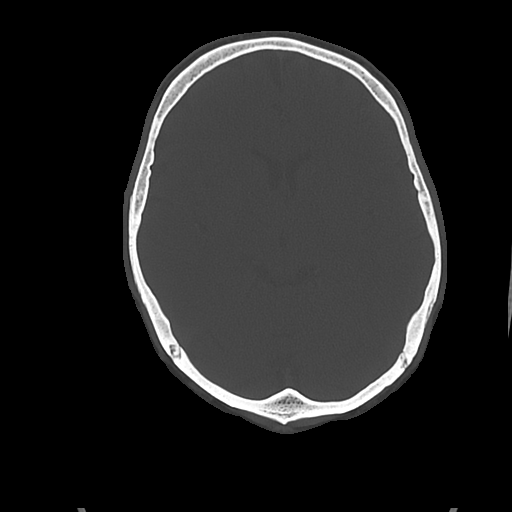

[Series 5: cor soft · coronal · 0.33mm/px · 3 of 71 slices shown]
[im 32/71  brain]
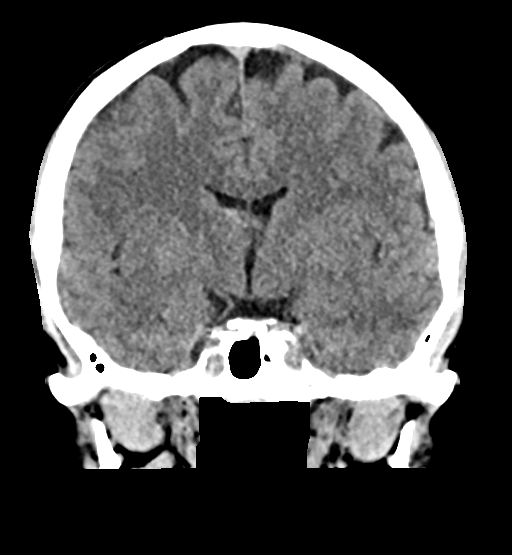
[im 39/71  brain]
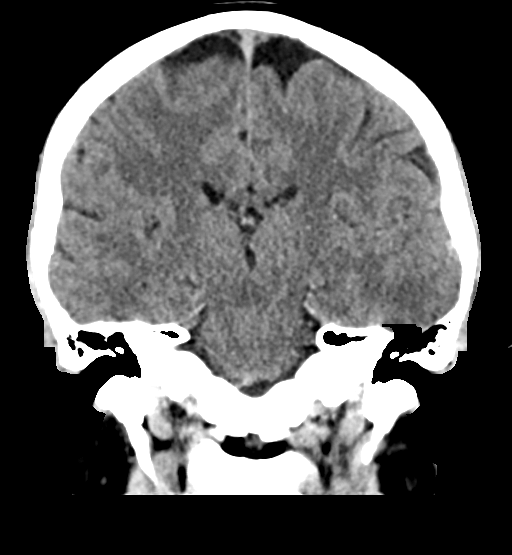
[im 47/71  brain]
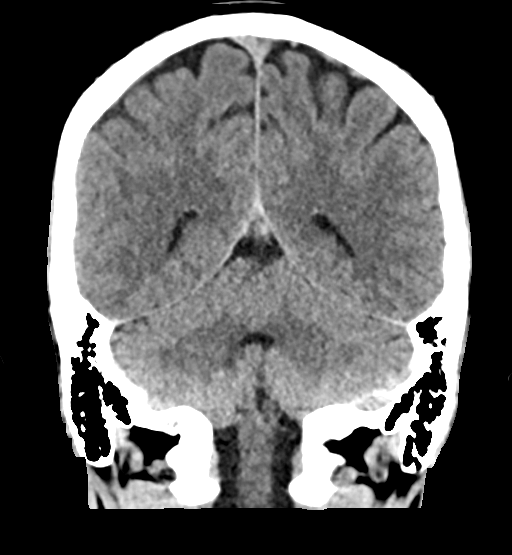

[Series 6: sag soft · sagittal · 0.36mm/px · 3 of 56 slices shown]
[im 19/56  brain]
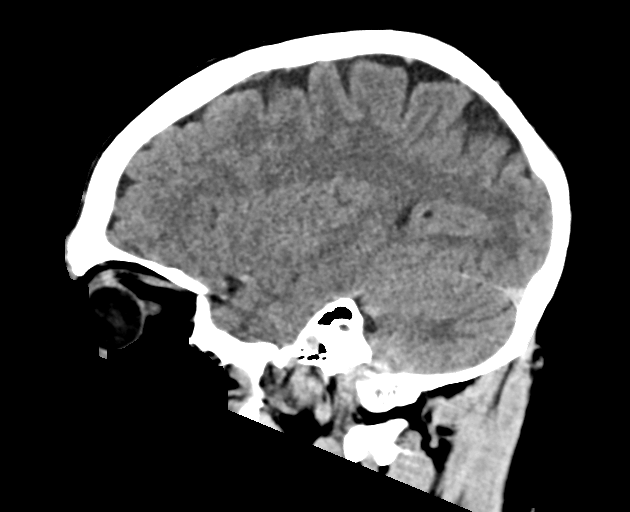
[im 28/56  brain]
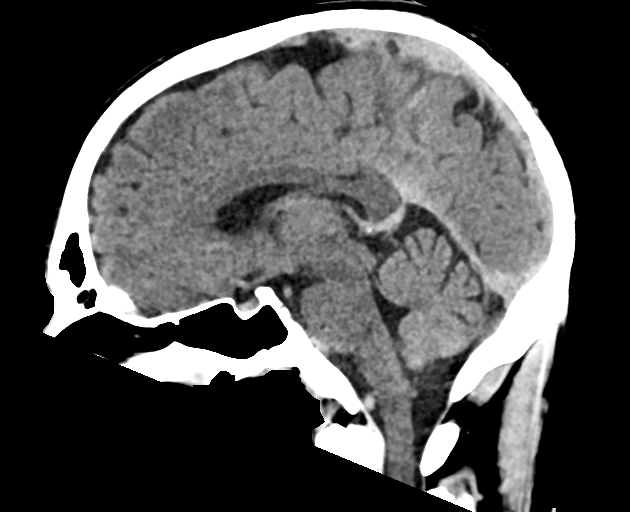
[im 37/56  brain]
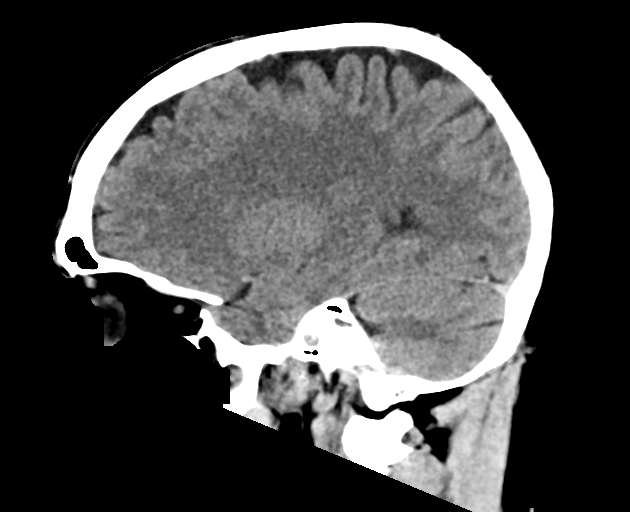

[17 of 47 positions shown; findings below may reference images not displayed]

FINDINGS: Brain: No evidence of acute large vascular territory infarction,
hemorrhage, hydrocephalus, extra-axial collection or mass
lesion/mass effect.

Vascular: No hyperdense vessel identified.

Skull: No acute fracture.

Sinuses/Orbits: No acute finding.

Other: No mastoid effusions.

ASPECTS ([place] Stroke Program Early CT Score) total score (0-10
with 10 being normal): 10.
IMPRESSION: No evidence of acute large vascular territory infarct or acute
hemorrhage. ASPECTS is 10.

Code stroke imaging results were communicated on 04/30/2021 at [DATE] to provider Dr. Ggit via secure text paging.

## 2023-06-21 ENCOUNTER — Other Ambulatory Visit: Payer: Self-pay

## 2023-08-15 ENCOUNTER — Other Ambulatory Visit (HOSPITAL_COMMUNITY): Payer: Self-pay

## 2023-09-23 ENCOUNTER — Other Ambulatory Visit (HOSPITAL_COMMUNITY): Payer: Self-pay

## 2023-11-08 ENCOUNTER — Other Ambulatory Visit (HOSPITAL_COMMUNITY): Payer: Self-pay

## 2023-12-27 ENCOUNTER — Other Ambulatory Visit: Payer: Self-pay | Admitting: Internal Medicine

## 2023-12-28 ENCOUNTER — Other Ambulatory Visit (HOSPITAL_COMMUNITY): Payer: Self-pay

## 2023-12-28 ENCOUNTER — Other Ambulatory Visit: Payer: Self-pay

## 2023-12-28 MED ORDER — ROSUVASTATIN CALCIUM 20 MG PO TABS
20.0000 mg | ORAL_TABLET | Freq: Every day | ORAL | 3 refills | Status: AC
  Filled 2023-12-28: qty 90, 90d supply, fill #0
  Filled 2024-03-28: qty 90, 90d supply, fill #1
  Filled 2024-06-13: qty 90, 90d supply, fill #2

## 2023-12-28 MED ORDER — ASPIRIN 81 MG PO TBEC
81.0000 mg | DELAYED_RELEASE_TABLET | Freq: Every day | ORAL | 3 refills | Status: AC
  Filled 2023-12-28: qty 90, 90d supply, fill #0
  Filled 2024-03-28: qty 90, 90d supply, fill #1
  Filled 2024-06-13: qty 90, 90d supply, fill #2

## 2024-01-27 DIAGNOSIS — H5213 Myopia, bilateral: Secondary | ICD-10-CM | POA: Diagnosis not present

## 2024-02-09 ENCOUNTER — Encounter: Payer: Self-pay | Admitting: Internal Medicine

## 2024-02-09 NOTE — Patient Instructions (Addendum)
 Tdap given.    Medications changes include :   None     Return in about 1 year (around 02/09/2025) for Physical Exam.    Health Maintenance, Male Adopting a healthy lifestyle and getting preventive care are important in promoting health and wellness. Ask your health care provider about: The right schedule for you to have regular tests and exams. Things you can do on your own to prevent diseases and keep yourself healthy. What should I know about diet, weight, and exercise? Eat a healthy diet  Eat a diet that includes plenty of vegetables, fruits, low-fat dairy products, and lean protein. Do not eat a lot of foods that are high in solid fats, added sugars, or sodium. Maintain a healthy weight Body mass index (BMI) is a measurement that can be used to identify possible weight problems. It estimates body fat based on height and weight. Your health care provider can help determine your BMI and help you achieve or maintain a healthy weight. Get regular exercise Get regular exercise. This is one of the most important things you can do for your health. Most adults should: Exercise for at least 150 minutes each week. The exercise should increase your heart rate and make you sweat (moderate-intensity exercise). Do strengthening exercises at least twice a week. This is in addition to the moderate-intensity exercise. Spend less time sitting. Even light physical activity can be beneficial. Watch cholesterol and blood lipids Have your blood tested for lipids and cholesterol at 42 years of age, then have this test every 5 years. You may need to have your cholesterol levels checked more often if: Your lipid or cholesterol levels are high. You are older than 42 years of age. You are at high risk for heart disease. What should I know about cancer screening? Many types of cancers can be detected early and may often be prevented. Depending on your health history and family history, you may  need to have cancer screening at various ages. This may include screening for: Colorectal cancer. Prostate cancer. Skin cancer. Lung cancer. What should I know about heart disease, diabetes, and high blood pressure? Blood pressure and heart disease High blood pressure causes heart disease and increases the risk of stroke. This is more likely to develop in people who have high blood pressure readings or are overweight. Talk with your health care provider about your target blood pressure readings. Have your blood pressure checked: Every 3-5 years if you are 30-34 years of age. Every year if you are 83 years old or older. If you are between the ages of 79 and 52 and are a current or former smoker, ask your health care provider if you should have a one-time screening for abdominal aortic aneurysm (AAA). Diabetes Have regular diabetes screenings. This checks your fasting blood sugar level. Have the screening done: Once every three years after age 35 if you are at a normal weight and have a low risk for diabetes. More often and at a younger age if you are overweight or have a high risk for diabetes. What should I know about preventing infection? Hepatitis B If you have a higher risk for hepatitis B, you should be screened for this virus. Talk with your health care provider to find out if you are at risk for hepatitis B infection. Hepatitis C Blood testing is recommended for: Everyone born from [birth year] through 1964. Anyone with known risk factors for hepatitis C. Sexually transmitted infections (STIs) You should  be screened each year for STIs, including gonorrhea and chlamydia, if: You are sexually active and are younger than 42 years of age. You are older than 42 years of age and your health care provider tells you that you are at risk for this type of infection. Your sexual activity has changed since you were last screened, and you are at increased risk for chlamydia or gonorrhea. Ask your health  care provider if you are at risk. Ask your health care provider about whether you are at high risk for HIV. Your health care provider may recommend a prescription medicine to help prevent HIV infection. If you choose to take medicine to prevent HIV, you should first get tested for HIV. You should then be tested every 3 months for as long as you are taking the medicine. Follow these instructions at home: Alcohol use Do not drink alcohol if your health care provider tells you not to drink. If you drink alcohol: Limit how much you have to 0-2 drinks a day. Know how much alcohol is in your drink. In the [place] one drink equals one 12 oz bottle of beer (355 mL), one 5 oz glass of wine (148 mL), or one 1 oz glass of hard liquor (44 mL). Lifestyle Do not use any products that contain nicotine or tobacco. These products include cigarettes, chewing tobacco, and vaping devices, such as e-cigarettes. If you need help quitting, ask your health care provider. Do not use street drugs. Do not share needles. Ask your health care provider for help if you need support or information about quitting drugs. General instructions Schedule regular health, dental, and eye exams. Stay current with your vaccines. Tell your health care provider if: You often feel depressed. You have ever been abused or do not feel safe at home. Summary Adopting a healthy lifestyle and getting preventive care are important in promoting health and wellness. Follow your health care provider's instructions about healthy diet, exercising, and getting tested or screened for diseases. Follow your health care provider's instructions on monitoring your cholesterol and blood pressure. This information is not intended to replace advice given to you by your health care provider. Make sure you discuss any questions you have with your health care provider. Document Revised: 11/17/2020 Document Reviewed: 11/17/2020 Elsevier Patient Education  2024  [employer].

## 2024-02-09 NOTE — Progress Notes (Unsigned)
 Subjective:    Patient ID: Steve Dickerson, male    DOB: [birth year], 43 y.o.   MRN: [id number]     HPI Maahir is here for a physical exam and his chronic medical problems.   Doing well-no concerns.  Had blood work done earlier this year for doctors day and everything looked good.  LDL is at goal of less than 70   Medications and allergies reviewed with patient and updated if appropriate.  Current Outpatient Medications on File Prior to Visit  Medication Sig Dispense Refill   amoxicillin  (AMOXIL ) 500 MG capsule Take 4 capsules (2,000 mg total) by mouth daily for 2 days prior to dental procedure. 8 capsule 0   amoxicillin  (AMOXIL ) 500 MG capsule Take 4 capsules (2,000 mg total) by mouth 1 hour before dental procedure as directed. 8 capsule 2   aspirin  EC (ASPIRIN  LOW DOSE) 81 MG tablet Take 1 tablet (81 mg total) by mouth daily. Swallow whole. 90 tablet 3   Cholecalciferol  (VITAMIN D3 SUPER STRENGTH) 50 MCG (2000 UT) CAPS Take 1 capsule (2,000 Units total) by mouth 3 (three) times a week. 30 capsule 4   multivitamin (ONE-A-DAY MEN'S) TABS tablet Take 1 tablet by mouth 3 (three) times a week.     rosuvastatin  (CRESTOR ) 20 MG tablet Take 1 tablet (20 mg total) by mouth daily. 90 tablet 3   venlafaxine  XR (EFFEXOR  XR) 37.5 MG 24 hr capsule Take 1 capsule by mouth daily with breakfast. 90 capsule 3   Vitamin D , Ergocalciferol , (DRISDOL ) 1.25 MG (50000 UNIT) CAPS capsule Take 1 capsule (50,000 Units total) by mouth every 7 (seven) days. 8 capsule 0   No current facility-administered medications on file prior to visit.    Review of Systems  Constitutional:  Negative for fever.  Eyes:  Negative for visual disturbance.  Respiratory:  Negative for cough, shortness of breath and wheezing.   Cardiovascular:  Negative for chest pain, palpitations and leg swelling.  Gastrointestinal:  Negative for abdominal pain, blood in stool, constipation and diarrhea.       No gerd  Genitourinary:   Negative for difficulty urinating, dysuria and hematuria.  Musculoskeletal:  Positive for arthralgias (Mild). Negative for back pain.  Skin:  Negative for rash.  Neurological:  Negative for light-headedness and headaches.  Psychiatric/Behavioral:  Negative for dysphoric mood. The patient is not nervous/anxious.        Objective:   Vitals:   02/10/24 0848  BP: 128/82  Pulse: 65  Temp: 98.1 F (36.7 C)  SpO2: 96%   Filed Weights   02/10/24 0848  Weight: 169 lb (76.7 kg)   Body mass index is 22.92 kg/m.  BP Readings from Last 3 Encounters:  02/10/24 128/82  12/21/21 126/82  06/22/21 132/80    Wt Readings from Last 3 Encounters:  02/10/24 169 lb (76.7 kg)  12/21/21 174 lb (78.9 kg)  06/22/21 183 lb (83 kg)      Physical Exam Constitutional: He appears well-developed and well-nourished. No distress.  HENT:  Head: Normocephalic and atraumatic.  Right Ear: External ear normal.  Left Ear: External ear normal.  Normal ear canals and TM b/l  Mouth/Throat: Oropharynx is clear and moist. Eyes: Conjunctivae and EOM are normal.  Neck: Neck supple. No tracheal deviation present. No thyromegaly present.  No carotid bruit  Cardiovascular: Normal rate, regular rhythm, normal heart sounds and intact distal pulses.   No murmur heard.  No lower extremity edema. Pulmonary/Chest: Effort normal and breath sounds  normal. No respiratory distress. He has no wheezes. He has no rales.  Abdominal: Soft. He exhibits no distension. There is no tenderness.  Genitourinary: deferred  Lymphadenopathy:   He has no cervical adenopathy.  Skin: Skin is warm and dry. He is not diaphoretic.  Psychiatric: He has a normal mood and affect. His behavior is normal.         Assessment & Plan:   Physical exam: Screening blood work  ordered Exercise   regular Weight normal Substance abuse   none   Reviewed recommended immunizations.  Tdap today   Health Maintenance  Topic Date Due    Pneumococcal Vaccine: 19-49 Years (1 of 2 - PCV) Never done   Hepatitis B Vaccines (1 of 3 - 19+ 3-dose series) Never done   HPV VACCINES (1 - 3-dose SCDM series) Never done   COVID-19 Vaccine (1 - 2024-25 season) Never done   INFLUENZA VACCINE  02/10/2024   DTaP/Tdap/Td (2 - Td or Tdap) 02/09/2034   Hepatitis C Screening  Completed   HIV Screening  Completed   Meningococcal B Vaccine  Aged Out     See Problem List for Assessment and Plan of chronic medical problems.

## 2024-02-10 ENCOUNTER — Ambulatory Visit (INDEPENDENT_AMBULATORY_CARE_PROVIDER_SITE_OTHER): Admitting: Internal Medicine

## 2024-02-10 VITALS — BP 128/82 | HR 65 | Temp 98.1°F | Ht 72.0 in | Wt 169.0 lb

## 2024-02-10 DIAGNOSIS — Z23 Encounter for immunization: Secondary | ICD-10-CM | POA: Diagnosis not present

## 2024-02-10 DIAGNOSIS — E559 Vitamin D deficiency, unspecified: Secondary | ICD-10-CM

## 2024-02-10 DIAGNOSIS — Z0000 Encounter for general adult medical examination without abnormal findings: Secondary | ICD-10-CM

## 2024-02-10 DIAGNOSIS — Z125 Encounter for screening for malignant neoplasm of prostate: Secondary | ICD-10-CM

## 2024-02-10 DIAGNOSIS — Z8673 Personal history of transient ischemic attack (TIA), and cerebral infarction without residual deficits: Secondary | ICD-10-CM | POA: Diagnosis not present

## 2024-02-10 NOTE — Assessment & Plan Note (Addendum)
 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

## 2024-02-10 NOTE — Assessment & Plan Note (Addendum)
Chronic Taking vitamin d daily 

## 2024-02-13 ENCOUNTER — Other Ambulatory Visit: Payer: Self-pay | Admitting: Internal Medicine

## 2024-02-14 ENCOUNTER — Other Ambulatory Visit: Payer: Self-pay

## 2024-02-14 ENCOUNTER — Other Ambulatory Visit (HOSPITAL_COMMUNITY): Payer: Self-pay

## 2024-02-14 MED ORDER — VENLAFAXINE HCL ER 37.5 MG PO CP24
37.5000 mg | ORAL_CAPSULE | Freq: Every day | ORAL | 3 refills | Status: AC
  Filled 2024-02-14: qty 90, 90d supply, fill #0
  Filled 2024-05-13: qty 90, 90d supply, fill #1
  Filled 2024-08-07: qty 90, 90d supply, fill #2

## 2024-03-28 ENCOUNTER — Other Ambulatory Visit (HOSPITAL_COMMUNITY): Payer: Self-pay

## 2024-04-05 ENCOUNTER — Telehealth: Payer: Self-pay | Admitting: Family Medicine

## 2024-04-05 ENCOUNTER — Ambulatory Visit (INDEPENDENT_AMBULATORY_CARE_PROVIDER_SITE_OTHER)

## 2024-04-05 DIAGNOSIS — M25521 Pain in right elbow: Secondary | ICD-10-CM | POA: Diagnosis not present

## 2024-04-05 NOTE — Telephone Encounter (Signed)
 Right elbow pain throwing a soccer ball about 6 days ago.  Plan for xray

## 2024-04-11 ENCOUNTER — Ambulatory Visit: Payer: Self-pay | Admitting: Family Medicine

## 2024-04-11 NOTE — Progress Notes (Signed)
 Radiology read the x-ray is normal.  There is maybe a tiny avulsion visible on the lateral view at the anterior elbow.

## 2024-05-13 ENCOUNTER — Other Ambulatory Visit: Payer: Self-pay | Admitting: Internal Medicine

## 2024-05-14 ENCOUNTER — Other Ambulatory Visit (HOSPITAL_COMMUNITY): Payer: Self-pay

## 2024-05-14 ENCOUNTER — Other Ambulatory Visit: Payer: Self-pay

## 2024-05-14 MED ORDER — VITAMIN D (ERGOCALCIFEROL) 1.25 MG (50000 UNIT) PO CAPS
50000.0000 [IU] | ORAL_CAPSULE | ORAL | 0 refills | Status: DC
  Filled 2024-05-14: qty 8, 56d supply, fill #0

## 2024-06-13 ENCOUNTER — Other Ambulatory Visit (HOSPITAL_COMMUNITY): Payer: Self-pay

## 2024-08-07 ENCOUNTER — Other Ambulatory Visit: Payer: Self-pay | Admitting: Internal Medicine

## 2024-08-08 ENCOUNTER — Other Ambulatory Visit: Payer: Self-pay

## 2024-08-08 ENCOUNTER — Other Ambulatory Visit (HOSPITAL_COMMUNITY): Payer: Self-pay

## 2024-08-08 MED ORDER — VITAMIN D (ERGOCALCIFEROL) 1.25 MG (50000 UNIT) PO CAPS
50000.0000 [IU] | ORAL_CAPSULE | ORAL | 0 refills | Status: AC
  Filled 2024-08-08: qty 8, 56d supply, fill #0
